# Patient Record
Sex: Female | Born: 1965
Health system: Southern US, Community
[De-identification: ages and names within clinical notes are randomized; demographics above are authoritative.]

## PROBLEM LIST (undated history)

## (undated) ENCOUNTER — Emergency Department (HOSPITAL_COMMUNITY): Admission: EM | Disposition: A | Discharge status: Home / Self Care | Payer: BC Managed Care – PPO

## (undated) DIAGNOSIS — J189 Pneumonia, unspecified organism: Secondary | ICD-10-CM

## (undated) DIAGNOSIS — D649 Anemia, unspecified: Secondary | ICD-10-CM

## (undated) DIAGNOSIS — E669 Obesity, unspecified: Secondary | ICD-10-CM

## (undated) DIAGNOSIS — I10 Essential (primary) hypertension: Secondary | ICD-10-CM

## (undated) DIAGNOSIS — H409 Unspecified glaucoma: Secondary | ICD-10-CM

## (undated) HISTORY — DX: Obesity, unspecified: E66.9

## (undated) HISTORY — PX: TUBAL LIGATION: SHX77

---

## 1999-02-27 ENCOUNTER — Emergency Department (HOSPITAL_COMMUNITY): Admission: EM | Admit: 1999-02-27 | Discharge: 1999-02-27 | Payer: Self-pay | Admitting: Emergency Medicine

## 2000-09-12 ENCOUNTER — Emergency Department (HOSPITAL_COMMUNITY): Admission: EM | Admit: 2000-09-12 | Discharge: 2000-09-12 | Payer: Self-pay | Admitting: Emergency Medicine

## 2000-09-12 ENCOUNTER — Encounter: Payer: Self-pay | Admitting: Emergency Medicine

## 2000-09-17 ENCOUNTER — Emergency Department (HOSPITAL_COMMUNITY): Admission: EM | Admit: 2000-09-17 | Discharge: 2000-09-17 | Payer: Self-pay | Admitting: Emergency Medicine

## 2000-11-21 ENCOUNTER — Ambulatory Visit (HOSPITAL_COMMUNITY): Admission: RE | Admit: 2000-11-21 | Discharge: 2000-11-21 | Payer: Self-pay | Admitting: Family Medicine

## 2000-11-21 ENCOUNTER — Encounter: Payer: Self-pay | Admitting: Family Medicine

## 2003-02-28 ENCOUNTER — Other Ambulatory Visit: Admission: RE | Admit: 2003-02-28 | Discharge: 2003-02-28 | Payer: Self-pay | Admitting: Family Medicine

## 2003-10-04 ENCOUNTER — Inpatient Hospital Stay (HOSPITAL_COMMUNITY): Admission: EM | Admit: 2003-10-04 | Discharge: 2003-10-09 | Payer: Self-pay | Admitting: Emergency Medicine

## 2003-10-04 DIAGNOSIS — J154 Pneumonia due to other streptococci: Secondary | ICD-10-CM | POA: Insufficient documentation

## 2003-10-30 ENCOUNTER — Ambulatory Visit (HOSPITAL_COMMUNITY): Admission: RE | Admit: 2003-10-30 | Discharge: 2003-10-30 | Payer: Self-pay | Admitting: Family Medicine

## 2003-12-10 ENCOUNTER — Ambulatory Visit (HOSPITAL_COMMUNITY): Admission: RE | Admit: 2003-12-10 | Discharge: 2003-12-10 | Payer: Self-pay | Admitting: Family Medicine

## 2004-03-06 ENCOUNTER — Ambulatory Visit: Payer: Self-pay | Admitting: *Deleted

## 2004-03-19 ENCOUNTER — Ambulatory Visit: Payer: Self-pay | Admitting: Family Medicine

## 2004-08-08 ENCOUNTER — Ambulatory Visit: Payer: Self-pay | Admitting: Internal Medicine

## 2004-12-29 ENCOUNTER — Ambulatory Visit: Payer: Self-pay | Admitting: Family Medicine

## 2004-12-31 ENCOUNTER — Ambulatory Visit: Payer: Self-pay | Admitting: Family Medicine

## 2005-03-11 ENCOUNTER — Ambulatory Visit: Payer: Self-pay | Admitting: Family Medicine

## 2005-03-23 ENCOUNTER — Ambulatory Visit: Payer: Self-pay | Admitting: Family Medicine

## 2005-08-24 ENCOUNTER — Ambulatory Visit: Payer: Self-pay | Admitting: Family Medicine

## 2005-09-25 ENCOUNTER — Ambulatory Visit: Payer: Self-pay | Admitting: Family Medicine

## 2005-10-02 ENCOUNTER — Ambulatory Visit: Payer: Self-pay | Admitting: Family Medicine

## 2005-12-03 ENCOUNTER — Ambulatory Visit: Payer: Self-pay | Admitting: Family Medicine

## 2005-12-07 ENCOUNTER — Ambulatory Visit: Payer: Self-pay | Admitting: Family Medicine

## 2005-12-09 ENCOUNTER — Ambulatory Visit: Payer: Self-pay | Admitting: Family Medicine

## 2005-12-14 ENCOUNTER — Ambulatory Visit: Payer: Self-pay | Admitting: Family Medicine

## 2006-02-10 ENCOUNTER — Ambulatory Visit: Payer: Self-pay | Admitting: Obstetrics & Gynecology

## 2006-02-10 ENCOUNTER — Encounter (INDEPENDENT_AMBULATORY_CARE_PROVIDER_SITE_OTHER): Payer: Self-pay | Admitting: Family Medicine

## 2006-02-12 ENCOUNTER — Ambulatory Visit (HOSPITAL_COMMUNITY): Admission: RE | Admit: 2006-02-12 | Discharge: 2006-02-12 | Payer: Self-pay | Admitting: Family Medicine

## 2006-02-17 ENCOUNTER — Ambulatory Visit: Payer: Self-pay | Admitting: Obstetrics and Gynecology

## 2006-03-24 ENCOUNTER — Ambulatory Visit: Payer: Self-pay | Admitting: Obstetrics & Gynecology

## 2006-03-24 ENCOUNTER — Encounter (INDEPENDENT_AMBULATORY_CARE_PROVIDER_SITE_OTHER): Payer: Self-pay | Admitting: *Deleted

## 2006-03-24 ENCOUNTER — Ambulatory Visit (HOSPITAL_COMMUNITY): Admission: RE | Admit: 2006-03-24 | Discharge: 2006-03-24 | Payer: Self-pay | Admitting: Obstetrics & Gynecology

## 2006-04-07 ENCOUNTER — Ambulatory Visit: Payer: Self-pay | Admitting: Obstetrics & Gynecology

## 2006-04-21 ENCOUNTER — Ambulatory Visit: Payer: Self-pay | Admitting: Internal Medicine

## 2006-06-15 ENCOUNTER — Ambulatory Visit: Payer: Self-pay | Admitting: Family Medicine

## 2006-12-13 ENCOUNTER — Ambulatory Visit: Payer: Self-pay | Admitting: Family Medicine

## 2006-12-17 ENCOUNTER — Ambulatory Visit: Payer: Self-pay | Admitting: Family Medicine

## 2006-12-20 ENCOUNTER — Ambulatory Visit: Payer: Self-pay | Admitting: Family Medicine

## 2006-12-31 ENCOUNTER — Encounter (INDEPENDENT_AMBULATORY_CARE_PROVIDER_SITE_OTHER): Payer: Self-pay | Admitting: Family Medicine

## 2006-12-31 DIAGNOSIS — D649 Anemia, unspecified: Secondary | ICD-10-CM | POA: Insufficient documentation

## 2006-12-31 DIAGNOSIS — I10 Essential (primary) hypertension: Secondary | ICD-10-CM | POA: Insufficient documentation

## 2006-12-31 DIAGNOSIS — H409 Unspecified glaucoma: Secondary | ICD-10-CM | POA: Insufficient documentation

## 2006-12-31 DIAGNOSIS — J309 Allergic rhinitis, unspecified: Secondary | ICD-10-CM | POA: Insufficient documentation

## 2006-12-31 DIAGNOSIS — H269 Unspecified cataract: Secondary | ICD-10-CM | POA: Insufficient documentation

## 2006-12-31 DIAGNOSIS — K219 Gastro-esophageal reflux disease without esophagitis: Secondary | ICD-10-CM | POA: Insufficient documentation

## 2007-02-02 ENCOUNTER — Encounter (INDEPENDENT_AMBULATORY_CARE_PROVIDER_SITE_OTHER): Payer: Self-pay | Admitting: *Deleted

## 2007-06-16 ENCOUNTER — Telehealth (INDEPENDENT_AMBULATORY_CARE_PROVIDER_SITE_OTHER): Payer: Self-pay | Admitting: Nurse Practitioner

## 2007-06-16 ENCOUNTER — Ambulatory Visit: Payer: Self-pay | Admitting: Internal Medicine

## 2007-06-17 ENCOUNTER — Telehealth (INDEPENDENT_AMBULATORY_CARE_PROVIDER_SITE_OTHER): Payer: Self-pay | Admitting: Internal Medicine

## 2007-07-22 ENCOUNTER — Telehealth (INDEPENDENT_AMBULATORY_CARE_PROVIDER_SITE_OTHER): Payer: Self-pay | Admitting: Nurse Practitioner

## 2007-08-22 ENCOUNTER — Ambulatory Visit: Payer: Self-pay | Admitting: Family Medicine

## 2007-09-09 ENCOUNTER — Ambulatory Visit: Payer: Self-pay | Admitting: Nurse Practitioner

## 2007-09-28 ENCOUNTER — Telehealth (INDEPENDENT_AMBULATORY_CARE_PROVIDER_SITE_OTHER): Payer: Self-pay | Admitting: Internal Medicine

## 2007-10-22 ENCOUNTER — Encounter (INDEPENDENT_AMBULATORY_CARE_PROVIDER_SITE_OTHER): Payer: Self-pay | Admitting: Nurse Practitioner

## 2007-12-02 ENCOUNTER — Ambulatory Visit: Payer: Self-pay | Admitting: Family Medicine

## 2007-12-05 ENCOUNTER — Ambulatory Visit: Payer: Self-pay | Admitting: Family Medicine

## 2007-12-07 ENCOUNTER — Telehealth (INDEPENDENT_AMBULATORY_CARE_PROVIDER_SITE_OTHER): Payer: Self-pay | Admitting: *Deleted

## 2008-01-24 ENCOUNTER — Encounter (INDEPENDENT_AMBULATORY_CARE_PROVIDER_SITE_OTHER): Payer: Self-pay | Admitting: Family Medicine

## 2008-01-24 ENCOUNTER — Ambulatory Visit: Payer: Self-pay | Admitting: Family Medicine

## 2008-01-24 DIAGNOSIS — R809 Proteinuria, unspecified: Secondary | ICD-10-CM | POA: Insufficient documentation

## 2008-01-24 LAB — CONVERTED CEMR LAB
Bilirubin Urine: NEGATIVE
Chlamydia, DNA Probe: NEGATIVE
GC Probe Amp, Genital: NEGATIVE
Ketones, urine, test strip: NEGATIVE
Protein, U semiquant: 100
Specific Gravity, Urine: 1.02

## 2008-01-31 ENCOUNTER — Ambulatory Visit (HOSPITAL_COMMUNITY): Admission: RE | Admit: 2008-01-31 | Discharge: 2008-01-31 | Payer: Self-pay | Admitting: Family Medicine

## 2008-02-05 ENCOUNTER — Encounter (INDEPENDENT_AMBULATORY_CARE_PROVIDER_SITE_OTHER): Payer: Self-pay | Admitting: Family Medicine

## 2008-10-03 ENCOUNTER — Telehealth (INDEPENDENT_AMBULATORY_CARE_PROVIDER_SITE_OTHER): Payer: Self-pay | Admitting: Family Medicine

## 2008-11-12 ENCOUNTER — Ambulatory Visit: Payer: Self-pay | Admitting: Nurse Practitioner

## 2008-11-12 DIAGNOSIS — F17200 Nicotine dependence, unspecified, uncomplicated: Secondary | ICD-10-CM | POA: Insufficient documentation

## 2008-11-13 ENCOUNTER — Telehealth (INDEPENDENT_AMBULATORY_CARE_PROVIDER_SITE_OTHER): Payer: Self-pay | Admitting: Nurse Practitioner

## 2008-11-14 ENCOUNTER — Encounter (INDEPENDENT_AMBULATORY_CARE_PROVIDER_SITE_OTHER): Payer: Self-pay | Admitting: Nurse Practitioner

## 2008-11-15 ENCOUNTER — Ambulatory Visit: Payer: Self-pay | Admitting: Physician Assistant

## 2008-11-26 ENCOUNTER — Ambulatory Visit: Payer: Self-pay | Admitting: Nurse Practitioner

## 2008-11-28 ENCOUNTER — Ambulatory Visit: Payer: Self-pay | Admitting: Nurse Practitioner

## 2008-11-28 ENCOUNTER — Telehealth (INDEPENDENT_AMBULATORY_CARE_PROVIDER_SITE_OTHER): Payer: Self-pay | Admitting: Nurse Practitioner

## 2008-12-13 ENCOUNTER — Ambulatory Visit: Payer: Self-pay | Admitting: Physician Assistant

## 2009-03-15 ENCOUNTER — Ambulatory Visit: Payer: Self-pay | Admitting: Nurse Practitioner

## 2009-03-15 ENCOUNTER — Telehealth (INDEPENDENT_AMBULATORY_CARE_PROVIDER_SITE_OTHER): Payer: Self-pay | Admitting: Nurse Practitioner

## 2009-03-15 DIAGNOSIS — G44209 Tension-type headache, unspecified, not intractable: Secondary | ICD-10-CM | POA: Insufficient documentation

## 2009-03-27 ENCOUNTER — Encounter (INDEPENDENT_AMBULATORY_CARE_PROVIDER_SITE_OTHER): Payer: Self-pay | Admitting: Nurse Practitioner

## 2009-03-27 ENCOUNTER — Telehealth (INDEPENDENT_AMBULATORY_CARE_PROVIDER_SITE_OTHER): Payer: Self-pay | Admitting: Nurse Practitioner

## 2009-04-04 ENCOUNTER — Ambulatory Visit: Payer: Self-pay | Admitting: Physician Assistant

## 2009-04-24 ENCOUNTER — Telehealth (INDEPENDENT_AMBULATORY_CARE_PROVIDER_SITE_OTHER): Payer: Self-pay | Admitting: Nurse Practitioner

## 2009-05-13 ENCOUNTER — Telehealth (INDEPENDENT_AMBULATORY_CARE_PROVIDER_SITE_OTHER): Payer: Self-pay | Admitting: Nurse Practitioner

## 2009-11-04 ENCOUNTER — Ambulatory Visit: Payer: Self-pay | Admitting: Nurse Practitioner

## 2009-11-04 DIAGNOSIS — E6609 Other obesity due to excess calories: Secondary | ICD-10-CM | POA: Insufficient documentation

## 2009-11-04 LAB — CONVERTED CEMR LAB
Blood in Urine, dipstick: NEGATIVE
Nitrite: NEGATIVE
Urobilinogen, UA: 0.2

## 2009-11-05 ENCOUNTER — Encounter (INDEPENDENT_AMBULATORY_CARE_PROVIDER_SITE_OTHER): Payer: Self-pay | Admitting: Nurse Practitioner

## 2009-11-05 LAB — CONVERTED CEMR LAB
AST: 11 units/L (ref 0–37)
BUN: 13 mg/dL (ref 6–23)
Basophils Relative: 1 % (ref 0–1)
Calcium: 8.9 mg/dL (ref 8.4–10.5)
Chlamydia, DNA Probe: NEGATIVE
Chloride: 107 meq/L (ref 96–112)
Cholesterol: 158 mg/dL (ref 0–200)
Creatinine, Ser: 0.82 mg/dL (ref 0.40–1.20)
Eosinophils Relative: 2 % (ref 0–5)
GC Probe Amp, Genital: NEGATIVE
Glucose, Bld: 83 mg/dL (ref 70–99)
Hemoglobin: 9.9 g/dL — ABNORMAL LOW (ref 12.0–15.0)
Lymphocytes Relative: 20 % (ref 12–46)
MCHC: 30.2 g/dL (ref 30.0–36.0)
Monocytes Relative: 17 % — ABNORMAL HIGH (ref 3–12)
Neutro Abs: 2.5 10*3/uL (ref 1.7–7.7)
Total Protein: 8.1 g/dL (ref 6.0–8.3)
VLDL: 19 mg/dL (ref 0–40)
WBC: 4.2 10*3/uL (ref 4.0–10.5)

## 2009-11-07 ENCOUNTER — Ambulatory Visit: Payer: Self-pay | Admitting: Nurse Practitioner

## 2009-11-07 ENCOUNTER — Telehealth (INDEPENDENT_AMBULATORY_CARE_PROVIDER_SITE_OTHER): Payer: Self-pay | Admitting: Nurse Practitioner

## 2009-11-08 ENCOUNTER — Ambulatory Visit (HOSPITAL_COMMUNITY): Admission: RE | Admit: 2009-11-08 | Discharge: 2009-11-08 | Payer: Self-pay | Admitting: Internal Medicine

## 2009-11-11 ENCOUNTER — Telehealth (INDEPENDENT_AMBULATORY_CARE_PROVIDER_SITE_OTHER): Payer: Self-pay | Admitting: Nurse Practitioner

## 2009-11-13 ENCOUNTER — Encounter (INDEPENDENT_AMBULATORY_CARE_PROVIDER_SITE_OTHER): Payer: Self-pay | Admitting: *Deleted

## 2009-11-21 ENCOUNTER — Encounter (INDEPENDENT_AMBULATORY_CARE_PROVIDER_SITE_OTHER): Payer: Self-pay | Admitting: Nurse Practitioner

## 2010-01-29 ENCOUNTER — Telehealth (INDEPENDENT_AMBULATORY_CARE_PROVIDER_SITE_OTHER): Payer: Self-pay | Admitting: Nurse Practitioner

## 2010-05-01 ENCOUNTER — Telehealth (INDEPENDENT_AMBULATORY_CARE_PROVIDER_SITE_OTHER): Payer: Self-pay | Admitting: Nurse Practitioner

## 2010-06-17 NOTE — Assessment & Plan Note (Signed)
Summary: TB TEST CK  / NS   Nurse Visit   Allergies: 1)  ! Tylox  PPD Results    Date of reading: 11/07/2009    Results: < 5mm    Interpretation: negative  Orders Added: 1)  No Charge Patient Arrived (NCPA0) [NCPA0]

## 2010-06-17 NOTE — Letter (Signed)
Summary: Generic Letter  [facility]  [address] [place], [place] [address]   Phone: [phone]  Fax: [phone]    11/13/2009  [facility] [address] [place], [place]  [address]  Dear Ms. Mayford Knife,  We have been unable to contact you by telephone. Please call our office, at your earliest convenience, so that we may speak with you.   Sincerely,   Dutch Quint RN

## 2010-06-17 NOTE — Progress Notes (Signed)
Summary: [facility]   Phone Note From Other Clinic   Summary of Call: Tonya from [facility] called not enough blood to add b12 and ferritin. Initial call taken by: Vesta Mixer CMA,  November 07, 2009 12:18 PM  Follow-up for Phone Call        noted. will have pt start iron and recheck in anemia panel in 6 weeks Follow-up by: Lehman Prom FNP,  November 07, 2009 12:53 PM

## 2010-06-17 NOTE — Assessment & Plan Note (Signed)
Summary: Complete Physical Exam   Vital Signs:  Patient profile:   45 year old female Menstrual status:  regular LMP:     10/15/2009 Height:      66.5 inches Weight:      233.5 pounds BMI:     37.26 BSA:     2.17 Temp:     97.8 degrees F oral Pulse rate:   84 / minute Pulse rhythm:   regular Resp:     20 per minute BP sitting:   160 / 88  (left arm) Cuff size:   regular  Vitals Entered By: Gaylyn Cheers RN (November 04, 2009 9:03 AM)  Nutrition Counseling: Patient's BMI is greater than 25 and therefore counseled on weight management options. CC: CPP, TB test, Hypertension Management, Headache Is Patient Diabetic? No Pain Assessment Patient in pain? no       Does patient need assistance? Functional Status Self care Ambulation Normal Comments out of Atenolol and HCTZ for LMP (date): 10/15/2009 LMP - Character: normal     Menstrual Status regular Enter LMP: 10/15/2009 Last PAP Result NEGATIVE FOR INTRAEPITHELIAL LESIONS OR MALIGNANCY.   Primary Care Provider:  Lehman Prom FNP  CC:  CPP, TB test, Hypertension Management, and Headache.  History of Present Illness:  Pt into the office for a complete physical exam  PAP - Last done in 2009. All previous PAP Smears have been normal menses monthtly s/p tubal ligation x 14 years ago 2 children ages 80 and 82  Mammogram - last done in 2009 no family history of breast cancer no self breast checks at home  Dental - Pt has not been to the dentist in several years. She does have a tooth that needs to be removed but is prepared to find her own dentist and incur the cost  Optho - She had eye exams in Februay 2011, then May 5th, 2011 and her next appt is in August. pt has a history of glaucoma.  Obesity - no meaningful activity due to stretched work schedule Pt eats only once per day  Headache HPI:      Additional history: still takes naprosyn as needed for headache.    Hypertension History:      She complains of  headache, but denies chest pain and palpitations.  She notes no problems with any antihypertensive medication side effects.  No BP meds x 1 week - needs refill.  Further comments include: last blood pressure was fine when pt was taking medication.        Positive major cardiovascular risk factors include hypertension and current tobacco user.  Negative major cardiovascular risk factors include female age less than 72 years old.       Habits & Providers  Alcohol-Tobacco-Diet     Alcohol drinks/day: <1     Alcohol type: beer     Tobacco Status: current     Tobacco Counseling: to quit use of tobacco products     Cigarette Packs/Day: <0.25     Year Started: 2003  Exercise-Depression-Behavior     Does Patient Exercise: no     Have you felt down or hopeless? no     Have you felt little pleasure in things? no     Depression Counseling: not indicated; screening negative for depression     STD Risk: never     Drug Use: never     Seat Belt Use: always     Sun Exposure: infrequent  Comments: "I don't have time for exercise" PHQ-9  score = 3  Allergies (verified): 1)  ! Tylox  Social History: Packs/Day:  <0.25 Does Patient Exercise:  no STD Risk:  never Drug Use:  never Seat Belt Use:  always Sun Exposure-Excessive:  infrequent  Review of Systems General:  Denies fever. Eyes:  Denies blurring; wears glasses - last test done in 06/2009 . ENT:  Denies earache. CV:  Denies chest pain or discomfort. Resp:  Denies cough. GI:  Denies abdominal pain and constipation. GU:  Denies dysuria. MS:  Denies joint pain, muscle, and cramps. Derm:  Denies rash. Neuro:  Denies headaches. Psych:  Denies anxiety and depression.  Physical Exam  General:  alert.   Head:  normocephalic.   Eyes:  glasses Ears:  bil TM with bony landmarks present Nose:  no nasal discharge.   Mouth:  pharynx pink and moist and poor dentition.   Neck:  supple.   Chest Wall:  no mass.   Breasts:  no masses and  no abnormal thickening.   Lungs:  normal breath sounds.   Heart:  normal rate and regular rhythm.   Abdomen:  soft, non-tender, and normal bowel sounds.   Rectal:  no external abnormalities.   Msk:  up to the exam table Pulses:  R radial normal and L radial normal.   Extremities:  no edema Neurologic:  alert & oriented X3.   Skin:  color normal.   Psych:  Oriented X3.    Pelvic Exam  Vulva:      normal appearance.   Urethra and Bladder:      Urethra--no discharge.   Vagina:      copious discharge.   Cervix:      midposition.   Adnexa:      nontender bilaterally.   Rectum:      normal, heme negative stool.      Impression & Recommendations:  Problem # 1:  ROUTINE GYNECOLOGICAL EXAMINATION (ICD-V72.31) PAP done today labs done  EKG done continue optho f/u as scheduled rec dental exam PHQ-9 score = 3 tdap up to date PPD placed today for work Orders: Hemoccult Guaiac-1 spec.(in office) (82270) KOH/ WET Mount [id number]) Pap Smear, Thin Prep ( Collection of) (J8119) UA Dipstick w/o Micro (manual) (14782) T-Comprehensive Metabolic Panel [phone]) T-Lipid Profile (78469-62952) T-CBC w/Diff (84132-44010) T- GC Chlamydia (27253) T-TSH (66440-34742) Rapid HIV  (59563)  Problem # 2:  UNSPECIFIED BREAST SCREENING (ICD-V76.10) self breast exam placcard given mammogram ordered Orders: Mammogram (Screening) (Mammo)  Problem # 3:  TOBACCO ABUSE (ICD-305.1) advised smoking cessation  Problem # 4:  HYPERTENSION (ICD-401.9) BP elevated today but likely due to not taking meds for the past month. will give 90 day supply of meds handout given on low sodium diet EKG done today Her updated medication list for this problem includes:    Atenolol 100 Mg Tabs (Atenolol) ..... One tablet by mouth nightly for blood pressure    Hydrochlorothiazide 25 Mg Tabs (Hydrochlorothiazide) .Marland Kitchen... Take one (1) by mouth daily  Orders: EKG w/ Interpretation (93000) T-Urine Microalbumin  w/creat. ratio [phone])  Problem # 5:  OBESITY (ICD-278.00) advised pt to try to find time in her schedule for activity also need to eat several small meals per day  Complete Medication List: 1)  Atenolol 100 Mg Tabs (Atenolol) .... One tablet by mouth nightly for blood pressure 2)  Allegra 180 Mg Tabs (Fexofenadine hcl) .... By mouth once daily as needed for allergies 3)  Famotidine 20 Mg Tabs (Famotidine) .... One by mouth two  times a day 4)  Ferrous Sulfate 325 (65 Fe) Mg Tabs (Ferrous sulfate) .... One by mouth three times a day 5)  Naprosyn 500 Mg Tabs (Naproxen) .... One tablet by mouth two times a day as needed for pain 6)  Nasacort Aq 55 Mcg/act Aers (Triamcinolone acetonide(nasal)) .Marland Kitchen.. 1 spray in each nostril two times a day 7)  Flovent Hfa 44 Mcg/act Aero (Fluticasone propionate  hfa) .Marland Kitchen.. 1 spray two times a day 8)  Hydrochlorothiazide 25 Mg Tabs (Hydrochlorothiazide) .... Take one (1) by mouth daily 9)  Cyclobenzaprine Hcl 10 Mg Tabs (Cyclobenzaprine hcl) .... One tablet by mouth nightly as needed for muscles  Other Orders: TB Skin Test [phone]) Admin 1st Vaccine (21308) Admin 1st Vaccine [facility]) [phone])  Hypertension Assessment/Plan:      The patient's hypertensive risk group is category B: At least one risk factor (excluding diabetes) with no target organ damage.  Today's blood pressure is 160/88.  Her blood pressure goal is < 140/90.  Patient Instructions: 1)  Follow up on Thursday for PPD reading. 2)  Your blood pressure is high today but that is likely because you have not taken your medications.  Get your medications refilled and take it daily.  You should get a 90 day supply 3)  You will be notified of any abnormal lab results  4)  Follow up in 6 months for high blood pressure or sooner if necessary. Prescriptions: ATENOLOL 100 MG TABS (ATENOLOL) One tablet by mouth nightly for blood pressure  #90 x 1   Entered and Authorized by:   Lehman Prom FNP    Signed by:   Lehman Prom FNP on 11/04/2009   Method used:   Print then Give to Patient   RxID:   [id number] HYDROCHLOROTHIAZIDE 25 MG TABS (HYDROCHLOROTHIAZIDE) Take one (1) by mouth daily  #90 x 1   Entered and Authorized by:   Lehman Prom FNP   Signed by:   Lehman Prom FNP on 11/04/2009   Method used:   Print then Give to Patient   RxID:   [id number]   Prevention & Chronic Care Immunizations   Influenza vaccine: Not documented    Tetanus booster: 12/03/2005: Historical    Pneumococcal vaccine: Historical  (12/19/2005)  Other Screening   Pap smear: NEGATIVE FOR INTRAEPITHELIAL LESIONS OR MALIGNANCY.  (01/24/2008)   Pap smear action/deferral: Ordered  (11/04/2009)   Pap smear due: 11/05/2010    Mammogram: normal  (01/31/2008)   Mammogram action/deferral: Ordered  (11/04/2009)   Smoking status: current  (11/04/2009)   Smoking cessation counseling: yes  (11/04/2009)  Lipids   Total Cholesterol: Not documented   LDL: Not documented   LDL Direct: Not documented   HDL: Not documented   Triglycerides: Not documented  Hypertension   Last Blood Pressure: 160 / 88  (11/04/2009)   Serum creatinine: Not documented   BMP action: Ordered   Serum potassium Not documented CMP ordered   Self-Management Support :    Hypertension self-management support: Not documented  Laboratory Results   Urine Tests  Date/Time Received: November 04, 2009 10:05 AM   Routine Urinalysis   Color: lt. yellow Glucose: negative   (Normal Range: Negative) Bilirubin: negative   (Normal Range: Negative) Ketone: negative   (Normal Range: Negative) Spec. Gravity: >=1.030   (Normal Range: 1.003-1.035) Blood: negative   (Normal Range: Negative) pH: 6.0   (Normal Range: 5.0-8.0) Protein: negative   (Normal Range: Negative) Urobilinogen: 0.2   (Normal Range: 0-1) Nitrite: negative   (  Normal Range: Negative) Leukocyte Esterace: negative   (Normal Range: Negative)    Date/Time  Received: November 04, 2009 10:07 AM   Wet Mount Source: vaginal WBC/hpf: 1-5 Bacteria/hpf: rare Clue cells/hpf: none Yeast/hpf: none Wet Mount KOH: Negative Trichomonas/hpf: none  Other Tests  Rapid HIV: negative  Stool - Occult Blood Hemmoccult #1: negative Date: 10/28/2009       Laboratory Results   Urine Tests    Routine Urinalysis   Color: lt. yellow Glucose: negative   (Normal Range: Negative) Bilirubin: negative   (Normal Range: Negative) Ketone: negative   (Normal Range: Negative) Spec. Gravity: >=1.030   (Normal Range: 1.003-1.035) Blood: negative   (Normal Range: Negative) pH: 6.0   (Normal Range: 5.0-8.0) Protein: negative   (Normal Range: Negative) Urobilinogen: 0.2   (Normal Range: 0-1) Nitrite: negative   (Normal Range: Negative) Leukocyte Esterace: negative   (Normal Range: Negative)      Wet Mount/KOH  Other Tests  Rapid HIV: negative     PPD Application    Vaccine Type: PPD    Site: left forearm    Mfr: Sanofi Pasteur    Dose: 0.1 ml    Route: ID    Given by: Gaylyn Cheers RN    Exp. Date: 06/15/2011    Lot #: [id number]

## 2010-06-17 NOTE — Progress Notes (Signed)
Summary: Refill Naprosyn and Famotidine  Phone Note Refill Request Call back at [phone] Message from:  Connie Bryant  Refills Requested: Medication #1:  FAMOTIDINE 20 MG  TABS One by mouth two times a day  Medication #2:  NAPROSYN 500 MG TABS One tablet by mouth two times a day as needed for pain [facility] [address]  Initial call taken by: Oscar La,  January 29, 2010 2:47 PM  Follow-up for Phone Call        Refills completed and pt. notified.  Dutch Quint RN  January 29, 2010 2:52 PM     Prescriptions: NAPROSYN 500 MG TABS (NAPROXEN) One tablet by mouth two times a day as needed for pain  #50 x 0   Entered by:   Dutch Quint RN   Authorized by:   Lehman Prom FNP   Signed by:   Dutch Quint RN on 01/29/2010   Method used:   Electronically to        [facility] [phone]* (retail)       [address]       [place], [place]  [address]       Ph: [phone]       Fax: [phone]   RxID:   [id number] FAMOTIDINE 20 MG  TABS (FAMOTIDINE) One by mouth two times a day  #60 Each x 4   Entered by:   Dutch Quint RN   Authorized by:   Lehman Prom FNP   Signed by:   Dutch Quint RN on 01/29/2010   Method used:   Electronically to        [facility] [phone]* (retail)       [address]       [place], [place]  [address]       Ph: [phone]       Fax: [phone]   RxID:   [id number]

## 2010-06-17 NOTE — Letter (Signed)
Summary: Handout Printed  Printed Handout:  - Diet - Sodium-Controlled 

## 2010-06-17 NOTE — Progress Notes (Signed)
Summary: [name]   Phone Note Call from Patient Call back at [phone]   Summary of Call: [name] Connie Bryant THAT DR Barbaraann Barthel PRESCRIBED, BUT SHE STOPPED IT BECAUSE IT MADE HER NASUATED. SO IF YOU CALL SOMETHING IN, IT MAY NEED TO BE DIFFERENT KIND.  SHE USES [facility], BUT SHE SAYS THAT IF ITS SOMETHING AT [facility] [address] FOR $4, THEN SHE'LL GET IT THERE, IF NOT THEN [facility]. Initial call taken by: Leodis Rains,  November 11, 2009 2:47 PM  Follow-up for Phone Call        she can take over the counter iron - ferrous sulfate 325mg , one tablet by mouth three times a day  no prescription necessary perhaps she should try eating BEFORE she takes the iron tablet Follow-up by: Lehman Prom FNP,  November 13, 2009 7:49 AM  Additional Follow-up for Phone Call Additional follow up Details #1::        SPOKE WITH [name] AND SHE IS AWARE OF THE ABOVE MESSSAGE, AND SHE WILL PURCHASE IRON MEDS. FORM [facility]. SHE SAYS THAT SHE DOES ET BEFORE TAKING THE Bryant, BUT SHE STILL FEELS THE SAME. Additional Follow-up by: Leodis Rains,  November 13, 2009 12:37 PM

## 2010-06-19 NOTE — Progress Notes (Signed)
Summary: Wants to take vitamin  Phone Note Call from Patient   Summary of Call: PT CALLED SAID SHE CALLED 2 WEEKS AGO AND ASK IF IT WAS O.K FOR HER TO TAKE THE VITIMIN "ALIVE".NOBODY TOOK THE MESSAGE FOR HER SO SHE HAS WAITED 2 WKS FOR A CAll //PLEASE CALL HER BACK [phone] Initial call taken by: Arta Bruce,  May 01, 2010 12:28 PM  Follow-up for Phone Call        I am unfamiliar with this product -- your recommendation?  Dutch Quint RN  May 01, 2010 1:07 PM   Additional Follow-up for Phone Call Additional follow up Details #1::        i would need to know the ingredients in the vitamin.  i am unfamiliar with it as well. Can she bring a label or write down the contents and bring it here for me to review. Vitamins have MANY different names mostly due to a small change in ingredients so sorry i am not familiar with that particular vitamin Additional Follow-up by: Lehman Prom FNP,  May 01, 2010 1:18 PM    Additional Follow-up for Phone Call Additional follow up Details #2::    Pt. advised of provider's response -- verbalized understanding and agreement.  Dutch Quint RN  May 01, 2010 2:18 PM

## 2010-06-20 NOTE — Progress Notes (Signed)
Summary: Office Visit//DEPRESSION SCREENING  Office Visit//DEPRESSION SCREENING   Imported By: Arta Bruce 11/27/2009 15:23:12  _____________________________________________________________________  External Attachment:    Type:   Image     Comment:   External Document

## 2010-07-24 ENCOUNTER — Telehealth (INDEPENDENT_AMBULATORY_CARE_PROVIDER_SITE_OTHER): Payer: Self-pay | Admitting: Nurse Practitioner

## 2010-07-29 NOTE — Progress Notes (Signed)
Summary: Query:  Refill atenolol, HCTZ, famotidine & naprosyn?  Phone Note Call from Patient Call back at Home Phone [phone]   Reason for Call: Refill Medication Summary of Call: Connie PT. [name] CALLED AND SAYS THAT SHE NEEDS A REFILL ON HER ATENOLOL, HCTZ, FAMOTIDINE AND NAPROSYN. SHE USES [facility] [address].  PATIENT RECENTLY GOT MARRIED AND HER LAST NAME IS  Bumpas. Initial call taken by: Leodis Rains,  July 24, 2010 2:51 PM  Follow-up for Phone Call        Last seen 10/2009 for CPE.  Refill meds per protocol? Follow-up by: Dutch Quint RN,  July 25, 2010 12:14 PM  Additional Follow-up for Phone Call Additional follow up Details #1::        ok to refill per protocol Additional Follow-up by: Lehman Prom FNP,  July 25, 2010 12:39 PM    Additional Follow-up for Phone Call Additional follow up Details #2::    Noted.  Refills completed. Pt. notified.  Dutch Quint RN  July 25, 2010 12:50 PM   Prescriptions: HYDROCHLOROTHIAZIDE 25 MG TABS (HYDROCHLOROTHIAZIDE) Take one (1) by mouth daily  #90 x 0   Entered by:   Dutch Quint RN   Authorized by:   Lehman Prom FNP   Signed by:   Dutch Quint RN on 07/25/2010   Method used:   Electronically to        [facility] [phone]* (retail)       [address]       [place], [place]  [address]       Ph: [phone]       Fax: [phone]   RxID:   [id number] NAPROSYN 500 MG TABS (NAPROXEN) One tablet by mouth two times a day as needed for pain  #50 x 0   Entered by:   Dutch Quint RN   Authorized by:   Lehman Prom FNP   Signed by:   Dutch Quint RN on 07/25/2010   Method used:   Electronically to        [facility] [phone]* (retail)       [address] [place] [address]       [place], [place]  [address]       Ph: [phone]       Fax: [phone]   RxID:   [id number] FAMOTIDINE 20 MG  TABS (FAMOTIDINE) One by mouth two times a day  #60 Each x 3   Entered by:   Dutch Quint RN   Authorized by:    Lehman Prom FNP   Signed by:   Dutch Quint RN on 07/25/2010   Method used:   Electronically to        [facility] [phone]* (retail)       [address]       [place], [place]  [address]       Ph: [phone]       Fax: [phone]   RxID:   [id number] ATENOLOL 100 MG TABS (ATENOLOL) One tablet by mouth nightly for blood pressure  #90 x 0   Entered by:   Dutch Quint RN   Authorized by:   Lehman Prom FNP   Signed by:   Dutch Quint RN on 07/25/2010   Method used:   Electronically to        [facility] [phone]* (retail)       [address]       [place], [place]  [address]       Ph: [phone]       Fax: [phone]   RxID:   [id number]

## 2010-10-03 NOTE — Group Therapy Note (Signed)
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 Penne Lash who has seen her previously for a vaginal  myomectomy.           ______________________________  Argentina Donovan, MD     PR/MEDQ  D:  02/17/2006  T:  02/19/2006  Job:  [id number]

## 2010-10-03 NOTE — Op Note (Signed)
NAMELORIENE, TAUNTON NO.:  [id number]   MEDICAL RECORD NO.:  [id number]          PATIENT TYPE:  AMB   LOCATION:  [facility]                           FACILITY:  [facility]   PHYSICIAN:  Lesly Dukes, M.D. 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   PROCEDURE:  Examination under anesthesia, removal of cervical mass.   SURGEON:  Lesly Dukes, M.D.   ASSISTANT:  Ginger Carne, MD   ANESTHESIA:  General.   SPECIMENS:  Cervical mass.   ESTIMATED BLOOD LOSS:  50 cc.   COMPLICATIONS:  None.   PROCEDURE:  After informed consent was obtained, the patient was taken to  the operating room where general anesthesia was found to be adequate.  The  patient was placed in the dorsal lithotomy position and prepped and draped  in the normal sterile fashion.  The bladder was emptied.  Exam under  anesthesia performed . Retractors were placed into the vagina, and the mass  was grasped with the tenaculum.  It was found to be attached to the cervix  anteriorly.  This was removed with cautery.  There was good hemostasis.  The  uterus was then sounded to about 9.5 cm.  A couple of sutures of Vicryl were  used to aid in hemostasis in some areas that were slightly deeper to the  anterior lip.  A little bit of Monsel's was also applied.  There were no  other fibroids or masses felt on bimanual exam.  The patient tolerated the  procedure well.  The sponge, lap, instrument, and needle count were correct  x2.  Patient went to the recovery room in stable condition.           ______________________________  Lesly Dukes, M.D.     KHL/MEDQ  D:  03/24/2006  T:  03/24/2006  Job:  [id number]

## 2010-10-03 NOTE — Discharge Summary (Signed)
Connie Bryant, Connie Bryant NO.:  [id number]   MEDICAL RECORD NO.:  [id number]                   PATIENT TYPE:  INP   LOCATION:  5733                                 FACILITY:  [facility]   PHYSICIAN:  Hollice Espy, M.D.            DATE OF BIRTH:  1965-11-30   DATE OF ADMISSION:  10/04/2003  DATE OF DISCHARGE:  10/09/2003                                 DISCHARGE SUMMARY   DISCHARGE DIAGNOSES:  1. Streptococcus pneumococcus bacteremia secondary to #2.  2. Streptococcus pneumonia, right lower lobe.  3. Hypertension.  4. Iron-deficiency anemia.  5. Tobacco abuse.  6. History of glaucoma.  7. Hypokalemia.   DISCHARGE MEDICATIONS:  1. Ampicillin 500 mg p.o. every 6 hours for 8 days.  2. Atenolol 50 mg daily.  3. Travatan 1 drop each eye once daily.  4. Cosopt 1 drop each eye b.i.d.  5. Albuterol 2 puffs 4 times daily p.r.n.   HISTORY OF PRESENT ILLNESS:  This is a 45 year old African American female  with past medical history of tobacco abuse and hypertension and glaucoma,  who has been complaining of productive cough and shortness of breath for 10  days.  She lives at home with her two children who apparently had recently  receiving treatments for bronchitis.  She started having some persistent  nausea and vomiting and was unable to take much p.o.  She presented to the  emergency room on Oct 04, 2003.  At that time, she was noted to have a  potassium of 2.6 and a chest x-ray showed evidence of a right lower lobe  pneumonia.  The patient was started on IV fluids, had blood cultures drawn,  and was started on antibiotics.  By the second day, her blood culture was  noted to be positive for gram-positive cocci in pairs and chains.  The  cultures grew out Streptococcus pneumoniae which ended up being pan  sensitive including penicillin and ceftriaxone.  The patient was continued  on IV antibiotics which she tolerated well.   HOSPITAL COURSE:  1. In  regard to the patient's positive blood cultures and her pneumonia, she     was continued initially on Rocephin and Zithromax until her sensitivities     came back on the 23rd.  At this point, her white count had decreased     initially from 19.4 coming down from a near-normal range at 11.4 on May     22.  At this time, the patient was breathing much more comfortably and     was able to be weaned off of oxygen on May 23.  She was continued on IV     antibiotics until the May 23, when she was changed over to p.o.     antibiotics.  She tolerated this well and had no further problems.     Currently, she is breathing comfortably.  I have advised her to quit  smoking.  She will be discharged on an albuterol inhaler to take as     needed.  In addition, she will take 8 more days of ampicillin 500 mg     every 6 hours for a sum total of 14 days.  She had a followup chest x-ray     done on May 22, which showed evidence of persistent pneumonia, but with     some resolution.  The patient will follow up with her PCP in 1-2 weeks at     [facility] and get a repeat chest x-ray then.  2. In regard to the patient's history of glaucoma, she was continued on her     Cosopt and Travatan eye drops.  3. In regard to the patient's hypertension, she was continued on her     atenolol.  Her blood pressure was initially slightly elevated on     admission, but this improved.  4. In regard to the patient hypokalemia, she received potassium supplements.     It was felt her low potassium was subsequently secondary to her nausea     and vomiting.  Once she started receiving antibiotics and IV fluids, her     disposition improved and she was able to tolerate p.o. and her potassium     was within the normal range.  A potassium was checked on 5/21 and found     to be at 4.2.  5. In regard to the patient's anemia, the patient had initially presented     with an H&H of 10.6 and 32.8.  On subsequent days, this had  decreased     down to 8.6 and 26.2 by May 21.  Fecal hemoccult exam was done and the     patient was noted to be slightly heme positive.  However, a repeat H&H     done on May 24, three days later, had only shown a mild decrease at 8.5     and 26.1.  Iron studies were checked and the patient was found to have a     low iron, low TIBC and normal ferritin.  It was felt that likely she had     some form of either chronic disease or evidence of thalassemia.  He     advised that given her normal BUN and creatinine that her primary care     doctor followup on this consultation may benefit from erythropoietin     shots if she is found to have further decrease in her anemia.  Currently,     she is not orthostatic.  Her blood pressure and vitals are stable and she     shows no evidence of any acute bleeding.   PLAN:  Discharge the patient to home.  Her activity will be no heavy  exertion x1 weeks.  She is to be on a low-sodium diet.  She will followup  with her PCP, Dr. Barton Fanny at [facility] in 1- 2 weeks.   DISPOSITION:  Improved.  She is being discharged to home.                                                Hollice Espy, M.D.    SKK/MEDQ  D:  10/09/2003  T:  10/10/2003  Job:  [id number]   cc:   Fanny Dance. Rankins, M.D.  [address]  [place]  [place] [address]  Fax: [phone]

## 2010-10-03 NOTE — Group Therapy Note (Signed)
NAMEKAMEREN, BAADE NO.:  [id number]   MEDICAL RECORD NO.:  [id number]          PATIENT TYPE:  WOC   LOCATION:  [facility]                   FACILITY:  [facility]   PHYSICIAN:  Elsie Lincoln, MD      DATE OF BIRTH:  1965-10-17   DATE OF SERVICE:  02/10/2006                                    CLINIC NOTE   The patient is a 45 year old G-2, P-2, 0-0-2 female, LMP 01/28/2006 who  [facility] for unable to visualize cervix.  The patient also has some complaints of urinary incontinence.  Sometimes it  is urge incontinence, sometimes it is after coughing.  The patient states  that the amount that she dribbles does not bother her and does not want  treatment.  She has regular periods and is sexually active and does not have  postcoital spotting.  Her periods are moderate flow with normal pain.  She  says she has regular cycles.  She really has no complaints today except for  she needs to get a Pap smear.   PAST MEDICAL HISTORY:  Hypertension and glaucoma.   PAST SURGICAL HISTORY:  BTL.   PAST GYN HISTORY:  Last Pap smear two to three years ago.  Two NSVD's,  5  pounds 7 ounces and 6 pounds 9 ounces.  She had normal Pap smears, ovarian  cysts, fibroid tumors or sexually transmitted diseases.   FAMILY HISTORY:  Mother, two sisters and a brother have diabetes.  Grandmother has heart disease.  Grandfather and brother have had a heart  attack.  No first degree relatives have cancer.  Her mother has been on  dialysis.   ALLERGIES:  TYLOX.   MEDICATIONS:  Atenolol, __________ Cosopt eye drops, Allegra and Naprosyn.   REVIEW OF SYSTEMS:  Positive for numbness in the finger sometimes,  fevers  or night sweats, frequent headaches, dizzy spells and hot flashes.   SOCIAL HISTORY:  Smokes 6 to 7 cigarettes a day and drinks caffeinated  beverages.  She does not do drugs or alcohol or history of physical abuse.   PHYSICAL EXAM:  ABDOMEN:  Soft,  nontender, nondistended.  No rebound or  guarding.  GENITALIA:  Tanner five.  Vagina pink, normal rugae, small amount of  discharge.  Cervix cannot be visualized secondary to a large protruding mass  from the cervix that is pink and fleshy, consistent with a fibroid or polyp.  It Is more firm consistent with a fibroid.  Cervix is dilated to 2 to 3 cm.  The uterus fills normal size and anteverted.  Adnexa has no masses.  Rectovaginal exam is no nodularity.  The bladder cannot be well assessed for  a cystocele secondary to the large mass in the midline.  However, the  patient did not lose urine during the exam.   ASSESSMENT/PLAN:  A 45 year old female with most likely aborting fibroid or  polyp.  Will get transvaginal ultrasound to evaluate all structures and have  her return in a week for a plan of care.  Most likely will have removal of  the aborting fibroid in the  operating room.  The patient also needs a  mammogram scheduled.           ______________________________  Elsie Lincoln, MD     KL/MEDQ  D:  02/10/2006  T:  02/12/2006  Job:  [id number]

## 2013-03-25 ENCOUNTER — Encounter (HOSPITAL_COMMUNITY): Payer: Self-pay | Admitting: Emergency Medicine

## 2013-03-25 ENCOUNTER — Emergency Department (INDEPENDENT_AMBULATORY_CARE_PROVIDER_SITE_OTHER)
Admission: EM | Admit: 2013-03-25 | Discharge: 2013-03-25 | Disposition: A | Discharge status: Home / Self Care | Payer: BC Managed Care – PPO | Source: Home / Self Care | Attending: Family Medicine | Admitting: Family Medicine

## 2013-03-25 DIAGNOSIS — N949 Unspecified condition associated with female genital organs and menstrual cycle: Secondary | ICD-10-CM

## 2013-03-25 DIAGNOSIS — R102 Pelvic and perineal pain: Secondary | ICD-10-CM

## 2013-03-25 HISTORY — DX: Essential (primary) hypertension: I10

## 2013-03-25 HISTORY — DX: Unspecified glaucoma: H40.9

## 2013-03-25 HISTORY — DX: Anemia, unspecified: D64.9

## 2013-03-25 LAB — POCT URINALYSIS DIP (DEVICE)
Bilirubin Urine: NEGATIVE
Glucose, UA: NEGATIVE mg/dL
Hgb urine dipstick: NEGATIVE
Ketones, ur: NEGATIVE mg/dL
Nitrite: NEGATIVE
Protein, ur: NEGATIVE mg/dL
Specific Gravity, Urine: >=1.03 (ref 1.005–1.030)
pH: 6.5 (ref 5.0–8.0)

## 2013-03-25 NOTE — ED Notes (Signed)
Pt  Reports  Pain r  Side  rafdiating  Down  r  Lower quadrant      X  2  Days  denys  Any  Bleeding or  Other   Gyn  Problems       Walks  Upright  With a  Steady  Fluid  gait

## 2013-03-25 NOTE — ED Provider Notes (Signed)
CSN: [id number]     Arrival date & time 03/25/13  1631 History   First MD Initiated Contact with Patient 03/25/13 1702     Chief Complaint  Patient presents with  . Flank Pain   (Consider location/radiation/quality/duration/timing/severity/associated sxs/prior Treatment) Patient is a 47 y.o. female presenting with flank pain. The history is provided by the patient.  Flank Pain This is a new (s/p tubal lig.) problem. The current episode started 2 days ago. The problem has been gradually worsening. Associated symptoms include abdominal pain.    Past Medical History  Diagnosis Date  . Hypertension   . Glaucoma   . Anemia    History reviewed. No pertinent past surgical history. History reviewed. No pertinent family history. History  Substance Use Topics  . Smoking status: Current Every Day Smoker  . Smokeless tobacco: Not on file  . Alcohol Use: Yes     Comment: toddy  now  and  then   OB History   Grav Para Term Preterm Abortions TAB SAB Ect Mult Living                 Review of Systems  Constitutional: Negative.   Gastrointestinal: Positive for abdominal pain.  Genitourinary: Positive for pelvic pain. Negative for hematuria, flank pain, vaginal bleeding, vaginal discharge and menstrual problem.    Allergies  Tylox  Home Medications   Current Outpatient Rx  Name  Route  Sig  Dispense  Refill  . ATENOLOL PO   Oral   Take by mouth.         Marland Kitchen HYDROCHLOROTHIAZIDE PO   Oral   Take by mouth.          BP 152/82  Pulse 65  Temp(Src) 98 F (36.7 C) (Oral)  Resp 16  SpO2 100% Physical Exam  Nursing note and vitals reviewed. Constitutional: She is oriented to person, place, and time. She appears well-developed and well-nourished.  Abdominal: Soft. Normal appearance and bowel sounds are normal. There is tenderness in the suprapubic area. There is no rigidity, no rebound, no guarding and no CVA tenderness.    Neurological: She is alert and oriented to person,  place, and time.  Skin: Skin is warm and dry.    ED Course  Procedures (including critical care time) Labs Review Labs Reviewed  POCT URINALYSIS DIP (DEVICE)   Imaging Review No results found.  EKG Interpretation     Ventricular Rate:    PR Interval:    QRS Duration:   QT Interval:    QTC Calculation:   R Axis:     Text Interpretation:              MDM  Discussed with [facility], will see.    Linna Hoff, MD 03/25/13 [phone]

## 2013-03-25 NOTE — ED Notes (Signed)
Reported  To ginger    At  womens  mau        Pt  Advised  To  Remain npo  And  She  Will proceed  To  womens   mau

## 2013-04-25 ENCOUNTER — Ambulatory Visit: Payer: BC Managed Care – PPO | Attending: Internal Medicine | Admitting: Internal Medicine

## 2013-04-25 ENCOUNTER — Encounter: Payer: Self-pay | Admitting: Internal Medicine

## 2013-04-25 VITALS — BP 135/94 | HR 65 | Temp 98.0°F | Resp 16 | Ht 68.0 in | Wt 204.0 lb

## 2013-04-25 DIAGNOSIS — Z0000 Encounter for general adult medical examination without abnormal findings: Secondary | ICD-10-CM | POA: Insufficient documentation

## 2013-04-25 DIAGNOSIS — I10 Essential (primary) hypertension: Secondary | ICD-10-CM | POA: Insufficient documentation

## 2013-04-25 LAB — BASIC METABOLIC PANEL
CO2: 27 mEq/L (ref 19–32)
Calcium: 10 mg/dL (ref 8.4–10.5)
Creat: 0.86 mg/dL (ref 0.50–1.10)
Glucose, Bld: 83 mg/dL (ref 70–99)
Sodium: 135 mEq/L (ref 135–145)

## 2013-04-25 LAB — CBC WITH DIFFERENTIAL/PLATELET
Basophils Relative: 1 % (ref 0–1)
Eosinophils Absolute: 0.1 10*3/uL (ref 0.0–0.7)
Eosinophils Relative: 3 % (ref 0–5)
Lymphocytes Relative: 30 % (ref 12–46)
Lymphs Abs: 1.3 10*3/uL (ref 0.7–4.0)
MCH: 22.4 pg — ABNORMAL LOW (ref 26.0–34.0)
MCHC: 31.2 g/dL (ref 30.0–36.0)
MCV: 71.7 fL — ABNORMAL LOW (ref 78.0–100.0)
Monocytes Relative: 15 % — ABNORMAL HIGH (ref 3–12)
Neutrophils Relative %: 51 % (ref 43–77)
Platelets: 231 10*3/uL (ref 150–400)
RBC: 4.42 MIL/uL (ref 3.87–5.11)
WBC: 4.3 10*3/uL (ref 4.0–10.5)

## 2013-04-25 LAB — LIPID PANEL
Cholesterol: 154 mg/dL (ref 0–200)
HDL: 45 mg/dL (ref >39)
LDL Cholesterol: 92 mg/dL (ref 0–99)
Triglycerides: 86 mg/dL (ref <150)
VLDL: 17 mg/dL (ref 0–40)

## 2013-04-25 MED ORDER — POLYSACCHARIDE IRON COMPLEX 150 MG PO CAPS: 150.0000 mg | ORAL_CAPSULE | Freq: Every day | ORAL | Status: DC

## 2013-04-25 MED ORDER — HYDROCHLOROTHIAZIDE 25 MG PO TABS: 25.0000 mg | ORAL_TABLET | Freq: Every day | ORAL | Status: DC

## 2013-04-25 MED ORDER — ATENOLOL 100 MG PO TABS: 100.0000 mg | ORAL_TABLET | Freq: Every day | ORAL | Status: DC

## 2013-04-25 MED ORDER — FAMOTIDINE 20 MG PO TABS: 20.0000 mg | ORAL_TABLET | Freq: Every day | ORAL | Status: DC

## 2013-04-25 NOTE — Patient Instructions (Signed)

## 2013-04-25 NOTE — Progress Notes (Signed)
Patient ID: Connie Bryant, female   DOB: 01-05-1966, 47 y.o.   MRN: [id number]  CC: new pt  HPI: 47 year old female with past medical history of hypertension and iron deficiency anemia who presented to clinic to establish primary care physician. At this time pt only reports occasional pelvic pain but no abnormal vaginal discharge or bleeding.  Allergies  Allergen Reactions  . Tylox [Oxycodone-Acetaminophen]    Past Medical History  Diagnosis Date  . Hypertension   . Glaucoma   . Anemia    No current outpatient prescriptions on file prior to visit.   No current facility-administered medications on file prior to visit.   Family History  Problem Relation Age of Onset  . Diabetes Mother   . Kidney disease Mother   . Diabetes Sister   . Diabetes Brother   . Heart disease Brother    History   Social History  . Marital Status: Married    Spouse Name: N/A    Number of Children: N/A  . Years of Education: N/A   Occupational History  . Not on file.   Social History Main Topics  . Smoking status: Current Every Day Smoker  . Smokeless tobacco: Not on file  . Alcohol Use: Yes     Comment: toddy  now  and  then  . Drug Use: No  . Sexual Activity: Yes   Other Topics Concern  . Not on file   Social History Narrative  . No narrative on file    Review of Systems  Constitutional: Negative for fever, chills, diaphoresis, activity change, appetite change and fatigue.  HENT: Negative for ear pain, nosebleeds, congestion, facial swelling, rhinorrhea, neck pain, neck stiffness and ear discharge.   Eyes: Negative for pain, discharge, redness, itching and visual disturbance.  Respiratory: Negative for cough, choking, chest tightness, shortness of breath, wheezing and stridor.   Cardiovascular: Negative for chest pain, palpitations and leg swelling.  Gastrointestinal: Negative for abdominal distention.  Genitourinary: Negative for dysuria, urgency, frequency, hematuria, flank  pain, decreased urine volume, difficulty urinating and dyspareunia.  Musculoskeletal: Negative for back pain, joint swelling, arthralgias and gait problem.  Neurological: Negative for dizziness, tremors, seizures, syncope, facial asymmetry, speech difficulty, weakness, light-headedness, numbness and headaches.  Hematological: Negative for adenopathy. Does not bruise/bleed easily.  Psychiatric/Behavioral: Negative for hallucinations, behavioral problems, confusion, dysphoric mood, decreased concentration and agitation.    Objective:   Filed Vitals:   04/25/13 1412  BP: 135/94  Pulse: 65  Temp: 98 F (36.7 C)  Resp: 16    Physical Exam  Constitutional: Appears well-developed and well-nourished. No distress.  HENT: Normocephalic. External right and left ear normal. Oropharynx is clear and moist.  Eyes: Conjunctivae and EOM are normal. PERRLA, no scleral icterus.  Neck: Normal ROM. Neck supple. No JVD. No tracheal deviation. No thyromegaly.  CVS: RRR, S1/S2 +, no murmurs, no gallops, no carotid bruit.  Pulmonary: Effort and breath sounds normal, no stridor, rhonchi, wheezes, rales.  Abdominal: Soft. BS +,  no distension, tenderness, rebound or guarding.  Musculoskeletal: Normal range of motion. No edema and no tenderness.  Lymphadenopathy: No lymphadenopathy noted, cervical, inguinal. Neuro: Alert. Normal reflexes, muscle tone coordination. No cranial nerve deficit. Skin: Skin is warm and dry. No rash noted. Not diaphoretic. No erythema. No pallor.  Psychiatric: Normal mood and affect. Behavior, judgment, thought content normal.   No results found for this basename: WBC, HGB, HCT, MCV, PLT   No results found for this basename: CREATININE, BUN, NA,  K, CL, CO2    No results found for this basename: HGBA1C   Lipid Panel  No results found for this basename: chol, trig, hdl, cholhdl, vldl, ldlcalc       Assessment and plan:   Patient Active Problem List   Diagnosis Date Noted   . HTN (hypertension) 04/25/2013    Priority: High - We have discussed target BP range - I have advised pt to check BP regularly and to call us back if the numbers are higher than 140/90 - discussed the importance of compliance with medical therapy and diet  - continue atenolol 100 mg daily and Hctz 25 mg daily  . Preventative health care 04/25/2013    Priority: High - GYN referral - pelvic US - screening mammogram - declined flu shot

## 2013-04-25 NOTE — Progress Notes (Signed)
Pt is here to establish care. Pt as pain in the abdomen after her cycles and after sex.

## 2013-04-28 ENCOUNTER — Ambulatory Visit (HOSPITAL_COMMUNITY): Payer: BC Managed Care – PPO

## 2013-05-02 ENCOUNTER — Ambulatory Visit (HOSPITAL_COMMUNITY): Admission: RE | Admit: 2013-05-02 | Payer: BC Managed Care – PPO | Source: Ambulatory Visit

## 2013-05-10 ENCOUNTER — Other Ambulatory Visit: Payer: Self-pay | Admitting: Internal Medicine

## 2013-05-10 ENCOUNTER — Ambulatory Visit (HOSPITAL_COMMUNITY)
Admission: RE | Admit: 2013-05-10 | Discharge: 2013-05-10 | Disposition: A | Discharge status: Home / Self Care | Payer: BC Managed Care – PPO | Source: Ambulatory Visit | Attending: Internal Medicine | Admitting: Internal Medicine

## 2013-05-10 DIAGNOSIS — Z0000 Encounter for general adult medical examination without abnormal findings: Secondary | ICD-10-CM

## 2013-05-10 DIAGNOSIS — N83209 Unspecified ovarian cyst, unspecified side: Secondary | ICD-10-CM | POA: Insufficient documentation

## 2013-05-10 DIAGNOSIS — N949 Unspecified condition associated with female genital organs and menstrual cycle: Secondary | ICD-10-CM | POA: Insufficient documentation

## 2013-05-10 DIAGNOSIS — D259 Leiomyoma of uterus, unspecified: Secondary | ICD-10-CM | POA: Insufficient documentation

## 2013-05-16 ENCOUNTER — Telehealth: Payer: Self-pay | Admitting: Internal Medicine

## 2013-05-16 NOTE — Telephone Encounter (Signed)
Please f/u. Pt requesting [place] results

## 2013-05-16 NOTE — Telephone Encounter (Signed)
Pt would like the results of her pelvic exam explained to her; please f/u with pt

## 2013-05-26 ENCOUNTER — Telehealth: Payer: Self-pay

## 2013-05-26 DIAGNOSIS — D219 Benign neoplasm of connective and other soft tissue, unspecified: Secondary | ICD-10-CM

## 2013-05-26 NOTE — Telephone Encounter (Signed)
Spoke with patient she is aware of her lab and [place] results States she is already taking iron Will put in referral to gynecology

## 2013-06-14 ENCOUNTER — Encounter: Payer: BC Managed Care – PPO | Admitting: Obstetrics & Gynecology

## 2013-06-14 ENCOUNTER — Ambulatory Visit (HOSPITAL_COMMUNITY)
Admission: RE | Admit: 2013-06-14 | Discharge: 2013-06-14 | Disposition: A | Discharge status: Home / Self Care | Payer: BC Managed Care – PPO | Source: Ambulatory Visit | Attending: Internal Medicine | Admitting: Internal Medicine

## 2013-06-14 DIAGNOSIS — Z1231 Encounter for screening mammogram for malignant neoplasm of breast: Secondary | ICD-10-CM | POA: Insufficient documentation

## 2013-06-14 DIAGNOSIS — Z0000 Encounter for general adult medical examination without abnormal findings: Secondary | ICD-10-CM

## 2013-06-15 ENCOUNTER — Other Ambulatory Visit: Payer: Self-pay | Admitting: Internal Medicine

## 2013-06-15 DIAGNOSIS — R928 Other abnormal and inconclusive findings on diagnostic imaging of breast: Secondary | ICD-10-CM

## 2013-06-16 ENCOUNTER — Telehealth: Payer: Self-pay

## 2013-06-16 NOTE — Telephone Encounter (Signed)
Dr Charlies Silvers wanted me to follow up with patient in reference  To mass in her breast and make her an appointment here ASAP The number on file  Does not accept incoming calls Unable to leave message

## 2013-06-16 NOTE — Telephone Encounter (Signed)
Got in touch with patient Discussed with patient that she needed to get in contact with the  Breast center to have a follow up for possible mass in left breast She agreed and will  Call them today

## 2013-06-26 ENCOUNTER — Ambulatory Visit
Admission: RE | Admit: 2013-06-26 | Discharge: 2013-06-26 | Disposition: A | Discharge status: Home / Self Care | Payer: Self-pay | Source: Ambulatory Visit | Attending: Internal Medicine | Admitting: Internal Medicine

## 2013-06-26 ENCOUNTER — Ambulatory Visit
Admission: RE | Admit: 2013-06-26 | Discharge: 2013-06-26 | Disposition: A | Discharge status: Home / Self Care | Payer: BC Managed Care – PPO | Source: Ambulatory Visit | Attending: Internal Medicine | Admitting: Internal Medicine

## 2013-06-26 DIAGNOSIS — R928 Other abnormal and inconclusive findings on diagnostic imaging of breast: Secondary | ICD-10-CM

## 2013-07-24 ENCOUNTER — Encounter: Payer: Self-pay | Admitting: Internal Medicine

## 2013-07-24 ENCOUNTER — Ambulatory Visit: Payer: BC Managed Care – PPO | Attending: Internal Medicine | Admitting: Internal Medicine

## 2013-07-24 VITALS — BP 142/88 | HR 78 | Temp 98.6°F | Resp 16 | Ht 67.5 in | Wt 207.0 lb

## 2013-07-24 DIAGNOSIS — R51 Headache: Secondary | ICD-10-CM | POA: Insufficient documentation

## 2013-07-24 DIAGNOSIS — N920 Excessive and frequent menstruation with regular cycle: Secondary | ICD-10-CM | POA: Diagnosis not present

## 2013-07-24 DIAGNOSIS — N946 Dysmenorrhea, unspecified: Secondary | ICD-10-CM | POA: Insufficient documentation

## 2013-07-24 DIAGNOSIS — R21 Rash and other nonspecific skin eruption: Secondary | ICD-10-CM | POA: Insufficient documentation

## 2013-07-24 DIAGNOSIS — I10 Essential (primary) hypertension: Secondary | ICD-10-CM | POA: Insufficient documentation

## 2013-07-24 LAB — CBC WITH DIFFERENTIAL/PLATELET
Basophils Absolute: 0 10*3/uL (ref 0.0–0.1)
Basophils Relative: 1 % (ref 0–1)
EOS PCT: 3 % (ref 0–5)
Eosinophils Absolute: 0.1 10*3/uL (ref 0.0–0.7)
HCT: 28.8 % — ABNORMAL LOW (ref 36.0–46.0)
HEMOGLOBIN: 8.9 g/dL — AB (ref 12.0–15.0)
LYMPHS ABS: 0.9 10*3/uL (ref 0.7–4.0)
Lymphocytes Relative: 19 % (ref 12–46)
MCH: 21.2 pg — AB (ref 26.0–34.0)
MCHC: 30.9 g/dL (ref 30.0–36.0)
MCV: 68.6 fL — ABNORMAL LOW (ref 78.0–100.0)
MONOS PCT: 11 % (ref 3–12)
Monocytes Absolute: 0.5 10*3/uL (ref 0.1–1.0)
Neutro Abs: 3.2 10*3/uL (ref 1.7–7.7)
Neutrophils Relative %: 66 % (ref 43–77)
PLATELETS: 257 10*3/uL (ref 150–400)
RBC: 4.2 MIL/uL (ref 3.87–5.11)
RDW: 20.1 % — ABNORMAL HIGH (ref 11.5–15.5)
WBC: 4.8 10*3/uL (ref 4.0–10.5)

## 2013-07-24 LAB — COMPLETE METABOLIC PANEL WITH GFR
ALT: 8 U/L (ref 0–35)
AST: 14 U/L (ref 0–37)
Albumin: 3.8 g/dL (ref 3.5–5.2)
Alkaline Phosphatase: 54 U/L (ref 39–117)
BILIRUBIN TOTAL: 0.3 mg/dL (ref 0.2–1.2)
BUN: 12 mg/dL (ref 6–23)
CO2: 23 meq/L (ref 19–32)
CREATININE: 0.77 mg/dL (ref 0.50–1.10)
Calcium: 8.8 mg/dL (ref 8.4–10.5)
Chloride: 106 mEq/L (ref 96–112)
GLUCOSE: 91 mg/dL (ref 70–99)
Potassium: 4 mEq/L (ref 3.5–5.3)
Sodium: 135 mEq/L (ref 135–145)
Total Protein: 7.4 g/dL (ref 6.0–8.3)

## 2013-07-24 MED ORDER — HYDROCHLOROTHIAZIDE 25 MG PO TABS: 25.0000 mg | ORAL_TABLET | Freq: Every day | ORAL | Status: DC

## 2013-07-24 MED ORDER — TRAMADOL HCL 50 MG PO TABS: 50.0000 mg | ORAL_TABLET | Freq: Three times a day (TID) | ORAL | Status: DC | PRN

## 2013-07-24 MED ORDER — HYDROXYZINE HCL 25 MG PO TABS: 25.0000 mg | ORAL_TABLET | Freq: Three times a day (TID) | ORAL | Status: DC | PRN

## 2013-07-24 MED ORDER — FAMOTIDINE 20 MG PO TABS: 20.0000 mg | ORAL_TABLET | Freq: Every day | ORAL | Status: DC

## 2013-07-24 MED ORDER — HYDROCORTISONE 0.5 % EX OINT: 1.0000 "application " | TOPICAL_OINTMENT | Freq: Two times a day (BID) | CUTANEOUS | Status: DC

## 2013-07-24 MED ORDER — POLYSACCHARIDE IRON COMPLEX 150 MG PO CAPS: 150.0000 mg | ORAL_CAPSULE | Freq: Every day | ORAL | Status: DC

## 2013-07-24 MED ORDER — BUTALBITAL-APAP-CAFFEINE 50-325-40 MG PO TABS: 1.0000 | ORAL_TABLET | Freq: Three times a day (TID) | ORAL | Status: AC | PRN

## 2013-07-24 MED ORDER — ATENOLOL 100 MG PO TABS: 100.0000 mg | ORAL_TABLET | Freq: Every day | ORAL | Status: DC

## 2013-07-24 NOTE — Progress Notes (Signed)
Pt is here following up on her frequent headaches.

## 2013-07-24 NOTE — Progress Notes (Signed)
Patient ID: Connie Bryant, female   DOB: 1965/10/09, 48 y.o.   MRN: [id number]   CC:  HPI: Patient here with a headache, started yesterday, she reports 3 headaches a month. Sometimes in the past her menstrual cycle. The patient has a history of hypertension and anemic from heavy menstrual cycle due to fibroids. She takes Advil for headaches. She does describe some photophobia with the onset of the headache usually lasts one to 2 days. She states that she had an ultrasound of her pelvis that showed fibroids. She continues to bleed heavily.  She denies any epigastric pain, chest pain, shortness of breath She also describes itching rash on her dorsal aspect of both hands. She uses about two scented, lotions on her hand. The rash is just localized to her hand.  Allergies  Allergen Reactions  . Tylox [Oxycodone-Acetaminophen]    Past Medical History  Diagnosis Date  . Hypertension   . Glaucoma   . Anemia    No current outpatient prescriptions on file prior to visit.   No current facility-administered medications on file prior to visit.   Family History  Problem Relation Age of Onset  . Diabetes Mother   . Kidney disease Mother   . Diabetes Sister   . Diabetes Brother   . Heart disease Brother    History   Social History  . Marital Status: Married    Spouse Name: N/A    Number of Children: N/A  . Years of Education: N/A   Occupational History  . Not on file.   Social History Main Topics  . Smoking status: Current Every Day Smoker  . Smokeless tobacco: Not on file  . Alcohol Use: Yes     Comment: toddy  now  and  then  . Drug Use: No  . Sexual Activity: Yes   Other Topics Concern  . Not on file   Social History Narrative  . No narrative on file    Review of Systems  Constitutional: Negative for fever, chills, diaphoresis, activity change, appetite change and fatigue.  HENT: Negative for ear pain, nosebleeds, congestion, facial swelling, rhinorrhea, neck pain,  neck stiffness and ear discharge.   Eyes: Negative for pain, discharge, redness, itching and visual disturbance.  Respiratory: Negative for cough, choking, chest tightness, shortness of breath, wheezing and stridor.   Cardiovascular: Negative for chest pain, palpitations and leg swelling.  Gastrointestinal: Negative for abdominal distention.  Genitourinary: Negative for dysuria, urgency, frequency, hematuria, flank pain, decreased urine volume, difficulty urinating and dyspareunia.  Musculoskeletal: Negative for back pain, joint swelling, arthralgias and gait problem.  Neurological: Negative for dizziness, tremors, seizures, syncope, facial asymmetry, speech difficulty, weakness, light-headedness, numbness and headaches.  Hematological: Negative for adenopathy. Does not bruise/bleed easily.  Psychiatric/Behavioral: Negative for hallucinations, behavioral problems, confusion, dysphoric mood, decreased concentration and agitation.    Objective:   Filed Vitals:   07/24/13 1442  BP: 142/88  Pulse: 78  Temp: 98.6 F (37 C)  Resp: 16    Physical Exam  Constitutional: Appears well-developed and well-nourished. No distress.  HENT: Normocephalic. External right and left ear normal. Oropharynx is clear and moist.  Eyes: Conjunctivae and EOM are normal. PERRLA, no scleral icterus.  Neck: Normal ROM. Neck supple. No JVD. No tracheal deviation. No thyromegaly.  CVS: RRR, S1/S2 +, no murmurs, no gallops, no carotid bruit.  Pulmonary: Effort and breath sounds normal, no stridor, rhonchi, wheezes, rales.  Abdominal: Soft. BS +,  no distension, tenderness, rebound or guarding.  Musculoskeletal: Normal range of motion. No edema and no tenderness.  Lymphadenopathy: No lymphadenopathy noted, cervical, inguinal. Neuro: Alert. Normal reflexes, muscle tone coordination. No cranial nerve deficit. Skin papular rash on the dorsal aspect of both hands  Psychiatric: Normal mood and affect. Behavior, judgment,  thought content normal.   Lab Results  Component Value Date   WBC 4.3 04/25/2013   HGB 9.9* 04/25/2013   HCT 31.7* 04/25/2013   MCV 71.7* 04/25/2013   PLT 231 04/25/2013   Lab Results  Component Value Date   CREATININE 0.86 04/25/2013   BUN 11 04/25/2013   NA 135 04/25/2013   K 4.0 04/25/2013   CL 100 04/25/2013   CO2 27 04/25/2013    Lab Results  Component Value Date   HGBA1C 5.2 04/25/2013   Lipid Panel     Component Value Date/Time   CHOL 154 04/25/2013 1439   TRIG 86 04/25/2013 1439   HDL 45 04/25/2013 1439   CHOLHDL 3.4 04/25/2013 1439   VLDL 17 04/25/2013 1439   LDLCALC 92 04/25/2013 1439       Assessment and plan:   Patient Active Problem List   Diagnosis Date Noted  . HTN (hypertension) 04/25/2013  . Preventative health care 04/25/2013   Rash Patient has been instructed to stop using hand lotion, and try using unscented moisturizers aloe vera Also started the patient on hydroxyzine for itching and hydrocortisone ointment   Hypertension Refills of atenolol and HCTZ  Headache Most likely migraine could be exacerbated by anemia/hypertension Start the patient on Fioricet, tramadol  Dysmenorrhea/menorrhagia Gynecology referral for fibroids CBC  Patient to follow up in 2 months      The patient was given clear instructions to go to ER or return to medical center if symptoms don't improve, worsen or new problems develop. The patient verbalized understanding. The patient was told to call to get any lab results if not heard anything in the next week.

## 2013-07-26 ENCOUNTER — Encounter: Payer: Self-pay | Admitting: Obstetrics & Gynecology

## 2013-08-11 ENCOUNTER — Telehealth: Payer: Self-pay | Admitting: Internal Medicine

## 2013-08-11 ENCOUNTER — Other Ambulatory Visit: Payer: Self-pay

## 2013-08-11 ENCOUNTER — Ambulatory Visit: Payer: BC Managed Care – PPO | Attending: Internal Medicine

## 2013-08-11 DIAGNOSIS — D649 Anemia, unspecified: Secondary | ICD-10-CM

## 2013-08-11 LAB — CBC WITH DIFFERENTIAL/PLATELET
BASOS PCT: 1 % (ref 0–1)
Basophils Absolute: 0.1 10*3/uL (ref 0.0–0.1)
Eosinophils Absolute: 0.1 10*3/uL (ref 0.0–0.7)
Eosinophils Relative: 2 % (ref 0–5)
HCT: 27.8 % — ABNORMAL LOW (ref 36.0–46.0)
HEMOGLOBIN: 8.5 g/dL — AB (ref 12.0–15.0)
Lymphocytes Relative: 23 % (ref 12–46)
Lymphs Abs: 1.3 10*3/uL (ref 0.7–4.0)
MCH: 20.7 pg — ABNORMAL LOW (ref 26.0–34.0)
MCHC: 30.6 g/dL (ref 30.0–36.0)
MCV: 67.8 fL — ABNORMAL LOW (ref 78.0–100.0)
MONOS PCT: 14 % — AB (ref 3–12)
Monocytes Absolute: 0.8 10*3/uL (ref 0.1–1.0)
NEUTROS PCT: 60 % (ref 43–77)
Neutro Abs: 3.4 10*3/uL (ref 1.7–7.7)
PLATELETS: 308 10*3/uL (ref 150–400)
RBC: 4.1 MIL/uL (ref 3.87–5.11)
RDW: 20 % — ABNORMAL HIGH (ref 11.5–15.5)
WBC: 5.6 10*3/uL (ref 4.0–10.5)

## 2013-08-11 MED ORDER — IBUPROFEN 600 MG PO TABS: 600.0000 mg | ORAL_TABLET | Freq: Three times a day (TID) | ORAL | Status: DC | PRN

## 2013-08-11 NOTE — Telephone Encounter (Signed)
Pt is scheduled to come in today for repeat CBC Medication ordered for Ibuprofen 600 mg tab

## 2013-08-11 NOTE — Telephone Encounter (Signed)
Pt has appt GYN referral on 09/04/13 but is still having excessive menstruation and would like know what to do until then since she is feeling weak.

## 2013-08-12 ENCOUNTER — Encounter (HOSPITAL_COMMUNITY): Payer: Self-pay

## 2013-08-12 ENCOUNTER — Inpatient Hospital Stay (HOSPITAL_COMMUNITY)
Admission: AD | Admit: 2013-08-12 | Discharge: 2013-08-12 | Disposition: A | Discharge status: Home / Self Care | Payer: BC Managed Care – PPO | Source: Ambulatory Visit | Attending: Obstetrics & Gynecology | Admitting: Obstetrics & Gynecology

## 2013-08-12 DIAGNOSIS — N938 Other specified abnormal uterine and vaginal bleeding: Secondary | ICD-10-CM

## 2013-08-12 DIAGNOSIS — D259 Leiomyoma of uterus, unspecified: Secondary | ICD-10-CM | POA: Insufficient documentation

## 2013-08-12 DIAGNOSIS — N949 Unspecified condition associated with female genital organs and menstrual cycle: Secondary | ICD-10-CM

## 2013-08-12 DIAGNOSIS — F17200 Nicotine dependence, unspecified, uncomplicated: Secondary | ICD-10-CM | POA: Insufficient documentation

## 2013-08-12 DIAGNOSIS — N83209 Unspecified ovarian cyst, unspecified side: Secondary | ICD-10-CM | POA: Insufficient documentation

## 2013-08-12 DIAGNOSIS — D649 Anemia, unspecified: Secondary | ICD-10-CM

## 2013-08-12 LAB — URINALYSIS, ROUTINE W REFLEX MICROSCOPIC
Bilirubin Urine: NEGATIVE
GLUCOSE, UA: NEGATIVE mg/dL
Ketones, ur: NEGATIVE mg/dL
Leukocytes, UA: NEGATIVE
NITRITE: NEGATIVE
PH: 5 (ref 5.0–8.0)
PROTEIN: 30 mg/dL — AB
SPECIFIC GRAVITY, URINE: 1.025 (ref 1.005–1.030)
UROBILINOGEN UA: 0.2 mg/dL (ref 0.0–1.0)

## 2013-08-12 LAB — CBC
HCT: 28.2 % — ABNORMAL LOW (ref 36.0–46.0)
Hemoglobin: 8.4 g/dL — ABNORMAL LOW (ref 12.0–15.0)
MCH: 21 pg — AB (ref 26.0–34.0)
MCHC: 29.8 g/dL — ABNORMAL LOW (ref 30.0–36.0)
MCV: 70.5 fL — ABNORMAL LOW (ref 78.0–100.0)
Platelets: 234 10*3/uL (ref 150–400)
RBC: 4 MIL/uL (ref 3.87–5.11)
RDW: 19.6 % — ABNORMAL HIGH (ref 11.5–15.5)
WBC: 4.1 10*3/uL (ref 4.0–10.5)

## 2013-08-12 LAB — POCT PREGNANCY, URINE: Preg Test, Ur: NEGATIVE

## 2013-08-12 LAB — URINE MICROSCOPIC-ADD ON

## 2013-08-12 MED ORDER — MEGESTROL ACETATE 40 MG PO TABS: 40.0000 mg | ORAL_TABLET | Freq: Two times a day (BID) | ORAL | Status: DC

## 2013-08-12 NOTE — Discharge Instructions (Signed)
Abnormal Uterine Bleeding Abnormal uterine bleeding means bleeding from the vagina that is not your normal menstrual period. This can be:  Bleeding or spotting between periods.  Bleeding after sex (sexual intercourse).  Bleeding that is heavier or more than normal.  Periods that last longer than usual.  Bleeding after menopause. There are many problems that may cause this. Treatment will depend on the cause of the bleeding. Any kind of bleeding that is not normal should be reviewed by your doctor.  HOME CARE Watch your condition for any changes. These actions may lessen any discomfort you are having:  Do not use tampons or douches as told by your doctor.  Change your pads often. You should get regular pelvic exams and Pap tests. Keep all appointments for tests as told by your doctor. GET HELP IF:  You are bleeding for more than 1 week.  You feel dizzy at times. GET HELP RIGHT AWAY IF:   You pass out.  You have to change pads every 15 to 30 minutes.  You have belly pain.  You have a fever.  You become sweaty or weak.  You are passing large blood clots from the vagina.  You feel sick to your stomach (nauseous) and throw up (vomit). MAKE SURE YOU:  Understand these instructions.  Will watch your condition.  Will get help right away if you are not doing well or get worse. Document Released: 03/01/2009 Document Revised: 02/22/2013 Document Reviewed: 12/01/2012 [employer] Patient Information 2014 [employer].  

## 2013-08-12 NOTE — MAU Provider Note (Signed)
Attestation of Attending Supervision of Advanced Practitioner (PA/CNM/NP): Evaluation and management procedures were performed by the Advanced Practitioner under my supervision and collaboration.  I have reviewed the Advanced Practitioner's note and chart, and I agree with the management and plan.  Mohammedali Bedoy, MD, FACOG Attending Obstetrician & Gynecologist Faculty Practice, [facility]  

## 2013-08-12 NOTE — MAU Note (Signed)
Pt presents complaining vaginal bleeding since 3/21. States she had a normal period 3/11-3/19 and the bleeding restarted after intercourse on 3/21.

## 2013-08-12 NOTE — MAU Provider Note (Signed)
History     CSN: [id number]  Arrival date and time: 08/12/13 1334   First Provider Initiated Contact with Patient 08/12/13 1407      Chief Complaint  Patient presents with  . Vaginal Bleeding   Vaginal Bleeding Associated symptoms include abdominal pain. Pertinent negatives include no chills, constipation, diarrhea, dysuria, fever, nausea or vomiting.   THIS PATIENT HAS A DUPLICATE CHART UNDER A DIFFERENT MR NUMBER: MRN: [id number]  This is a 48 y.o. female who presents with c/o bleeding which started 3/21, 3 days after completing her normal cycle on 3/11-19.  Soaks pad in 2-3 hrs.  Has some cramping with it. Has known fibroids and anemia. Treated with iron. Has GYN referral appt on 3/20. Wants bleedng to stop.  States had [place] in Dec and CBC yesterday, but no results were found.   After I reordered CBC, I found she has a Chart Merge planned and has a duplicate chart as above, which contains more recent data.   RN Note:   Pt presents complaining vaginal bleeding since 3/21. States she had a normal period 3/11-3/19 and the bleeding restarted after intercourse on 3/21.       OB History   Grav Para Term Preterm Abortions TAB SAB Ect Mult Living   2 2              Past Medical History  Diagnosis Date  . Anemia     History reviewed. No pertinent past surgical history.  History reviewed. No pertinent family history.  History  Substance Use Topics  . Smoking status: Current Every Day Smoker  . Smokeless tobacco: Not on file  . Alcohol Use: No    Allergies:  Allergies  Allergen Reactions  . Oxycodone-Acetaminophen     No prescriptions prior to admission    Review of Systems  Constitutional: Negative for fever, chills and malaise/fatigue.  Gastrointestinal: Positive for abdominal pain. Negative for nausea, vomiting, diarrhea and constipation.  Genitourinary: Positive for vaginal bleeding. Negative for dysuria.       Vaginal bleeding  Neurological: Negative for  dizziness.   Physical Exam   Blood pressure 152/84, pulse 56, temperature 97.8 F (36.6 C), temperature source Oral, resp. rate 18, last menstrual period 07/26/2013.  Physical Exam  Constitutional: She appears well-developed and well-nourished. No distress.  Cardiovascular: Normal rate.   Respiratory: Effort normal.  GI: Soft. She exhibits no distension. There is no tenderness. There is no rebound and no guarding.  Genitourinary: Vaginal discharge (scant blood) found.  Internal pelvic deferred due to recent exam and upcoming GYN appt  Musculoskeletal: Normal range of motion.    MAU Course  Procedures  MDM [place] from 2014  FINDINGS:  Uterus  Measurements: 10.4 x 5.6 x 7.5 cm. 2.6 cm maximum diameter  hypoechoic nodular density noted in the anterior aspect of the  uterus most likely fibroid. A 1.3 cm maximum diameter hypoechoic  nodular density in the right lateral portion of the of upper portion  of the uterus also most likely a fibroid.  Endometrium  Thickness: 14 mm. No focal abnormality visualized.  Right ovary  Measurements: 3.0 x 2.1 x 2.6 cm. Normal appearance/no adnexal mass.  Left ovary  Measurements: 3.1 x 2.6 x 2.8 cm. 2.2 x 1.8 x 1.6 cm simple left  ovarian cyst.  Other findings  No free fluid.  IMPRESSION:  1. Fibroid uterus.  2. 2.2 x 1.8 x 1.6 cm simple left ovarian cyst. This is almost  certainly benign, but follow  up ultrasound is recommended in 1 year  according to the Society of Radiologists in [place] Statement (D Clovis Riley et al. Management of Asymptomatic  Ovarian and Other Adnexal Cysts Imaged at [place]: Society of  Radiologists in [place] Statement 2010.  Radiology 256 (Sept 2010): 258-527.).  Last office Hgb = Results for ASHAUNTI, TREPTOW (MRN [id number]) as of 08/12/2013 14:23  Ref. Range 08/11/2013 12:59  Hemoglobin Latest Range: 12.0-15.0 g/dL 8.5 (L)   Assessment and Plan  A:  Dysfunctional  Uterine Bleeding       Anemia  P:  Discharge home       Has appt in clinic        Rx Megace bid x 3 days then q day  [facility] 08/12/2013, 2:27 PM

## 2013-08-14 ENCOUNTER — Encounter (HOSPITAL_COMMUNITY): Payer: Self-pay

## 2013-09-04 ENCOUNTER — Encounter: Payer: Self-pay | Admitting: Obstetrics & Gynecology

## 2013-09-04 ENCOUNTER — Ambulatory Visit (INDEPENDENT_AMBULATORY_CARE_PROVIDER_SITE_OTHER): Payer: BC Managed Care – PPO | Admitting: Obstetrics & Gynecology

## 2013-09-04 VITALS — BP 131/85 | HR 78 | Temp 97.9°F | Ht 68.0 in | Wt 206.5 lb

## 2013-09-04 DIAGNOSIS — D259 Leiomyoma of uterus, unspecified: Secondary | ICD-10-CM

## 2013-09-04 DIAGNOSIS — D219 Benign neoplasm of connective and other soft tissue, unspecified: Secondary | ICD-10-CM

## 2013-09-04 DIAGNOSIS — D649 Anemia, unspecified: Secondary | ICD-10-CM

## 2013-09-04 DIAGNOSIS — N925 Other specified irregular menstruation: Secondary | ICD-10-CM

## 2013-09-04 DIAGNOSIS — Z0000 Encounter for general adult medical examination without abnormal findings: Secondary | ICD-10-CM

## 2013-09-04 DIAGNOSIS — N938 Other specified abnormal uterine and vaginal bleeding: Secondary | ICD-10-CM

## 2013-09-04 DIAGNOSIS — N949 Unspecified condition associated with female genital organs and menstrual cycle: Secondary | ICD-10-CM

## 2013-09-04 NOTE — Patient Instructions (Signed)
Hysterectomy Information  A hysterectomy is a surgery in which your uterus is removed. This surgery may be done to treat various medical problems. After the surgery, you will no longer have menstrual periods. The surgery will also make you unable to become pregnant (sterile). The fallopian tubes and ovaries can be removed (bilateral salpingo-oophorectomy) during this surgery as well.  REASONS FOR A HYSTERECTOMY  Persistent, abnormal bleeding.  Lasting (chronic) pelvic pain or infection.  The lining of the uterus (endometrium) starts growing outside the uterus (endometriosis).  The endometrium starts growing in the muscle of the uterus (adenomyosis).  The uterus falls down into the vagina (pelvic organ prolapse).  Noncancerous growths in the uterus (uterine fibroids) that cause symptoms.  Precancerous cells.  Cervical cancer or uterine cancer. TYPES OF HYSTERECTOMIES  Supracervical hysterectomy In this type, the top part of the uterus is removed, but not the cervix.  Total hysterectomy The uterus and cervix are removed.  Radical hysterectomy The uterus, the cervix, and the fibrous tissue that holds the uterus in place in the pelvis (parametrium) are removed. WAYS A HYSTERECTOMY CAN BE PERFORMED  Abdominal hysterectomy A large surgical cut (incision) is made in the abdomen. The uterus is removed through this incision.  Vaginal hysterectomy An incision is made in the vagina. The uterus is removed through this incision. There are no abdominal incisions.  Conventional laparoscopic hysterectomy Three or four small incisions are made in the abdomen. A thin, lighted tube with a camera (laparoscope) is inserted into one of the incisions. Other tools are put through the other incisions. The uterus is cut into small pieces. The small pieces are removed through the incisions, or they are removed through the vagina.  Laparoscopically assisted vaginal hysterectomy (LAVH) Three or four small  incisions are made in the abdomen. Part of the surgery is performed laparoscopically and part vaginally. The uterus is removed through the vagina.  Robot-assisted laparoscopic hysterectomy A laparoscope and other tools are inserted into 3 or 4 small incisions in the abdomen. A computer-controlled device is used to give the surgeon a 3D image and to help control the surgical instruments. This allows for more precise movements of surgical instruments. The uterus is cut into small pieces and removed through the incisions or removed through the vagina. RISKS AND COMPLICATIONS  Possible complications associated with this procedure include:  Bleeding and risk of blood transfusion. Tell your health care provider if you do not want to receive any blood products.  Blood clots in the legs or lung.  Infection.  Injury to surrounding organs.  Problems or side effects related to anesthesia.  Conversion to an abdominal hysterectomy from one of the other techniques. WHAT TO EXPECT AFTER A HYSTERECTOMY  You will be given pain medicine.  You will need to have someone with you for the first 3 5 days after you go home.  You will need to follow up with your surgeon in 2 4 weeks after surgery to evaluate your progress.  You may have early menopause symptoms such as hot flashes, night sweats, and insomnia.  If you had a hysterectomy for a problem that was not cancer or not a condition that could lead to cancer, then you no longer need Pap tests. However, even if you no longer need a Pap test, a regular exam is a good idea to make sure no other problems are starting. Document Released: 10/28/2000 Document Revised: 02/22/2013 Document Reviewed: 01/09/2013 [facility] Patient Information 2014 [place]. Diagnostic Laparoscopy Laparoscopy is a  surgical procedure. It is used to diagnose and treat diseases inside the belly (abdomen). It is usually a brief, common, and relatively simple procedure. The  laparoscopeis a thin, lighted, pencil-sized instrument. It is like a telescope. It is inserted into your abdomen through a small cut (incision). Your caregiver can look at the organs inside your body through this instrument. He or she can see if there is anything abnormal. Laparoscopy can be done either in a hospital or outpatient clinic. You may be given a mild sedative to help you relax before the procedure. Once in the operating room, you will be given a drug to make you sleep (general anesthesia). Laparoscopy usually lasts less than 1 hour. After the procedure, you will be monitored in a recovery area until you are stable and doing well. Once you are home, it will take 2 to 3 days to fully recover. RISKS AND COMPLICATIONS  Laparoscopy has relatively few risks. Your caregiver will discuss the risks with you before the procedure. Some problems that can occur include:  Infection.  Bleeding.  Damage to other organs.  Anesthetic side effects. PROCEDURE Once you receive anesthesia, your surgeon inflates the abdomen with a harmless gas (carbon dioxide). This makes the organs easier to see. The laparoscope is inserted into the abdomen through a small incision. This allows your surgeon to see into the abdomen. Other small instruments are also inserted into the abdomen through other small openings. Many surgeons attach a video camera to the laparoscope to enlarge the view. During a diagnostic laparoscopy, the surgeon may be looking for inflammation, infection, or cancer. Your surgeon may take tissue samples(biopsies). The samples are sent to a specialist in looking at cells and tissue samples (pathologist). The pathologist examines them under a microscope. Biopsies can help to diagnose or confirm a disease. AFTER THE PROCEDURE   The gas is released from inside the abdomen.  The incisions are closed with stitches (sutures). Because these incisions are small (usually less than 1/2 inch), there is usually  minimal discomfort after the procedure. There may be some mild discomfort in the throat. This is from the tube placed in the throat while you were sleeping. You may have some mild abdominal discomfort. There may also be discomfort from the instrument placement incisions in the abdomen.  The recovery time is shortened as long as there are no complications.  You will rest in a recovery room until stable and doing well. As long as there are no complications, you may be allowed to go home. FINDING OUT THE RESULTS OF YOUR TEST Not all test results are available during your visit. If your test results are not back during the visit, make an appointment with your caregiver to find out the results. Do not assume everything is normal if you have not heard from your caregiver or the medical facility. It is important for you to follow up on all of your test results. HOME CARE INSTRUCTIONS   Take all medicines as directed.  Only take over-the-counter or prescription medicines for pain, discomfort, or fever as directed by your caregiver.  Resume daily activities as directed.  Showers are preferred over baths.  You may resume sexual activities in 1 week or as directed.  Do not drive while taking narcotics. SEEK MEDICAL CARE IF:   There is increasing abdominal pain.  There is new pain in the shoulders (shoulder strap areas).  You feel lightheaded or faint.  You have the chills.  You or your child has an  oral temperature above 102 F (38.9 C).  There is pus-like (purulent) drainage from any of the wounds.  You are unable to pass gas or have a bowel movement.  You feel sick to your stomach (nauseous) or throw up (vomit). MAKE SURE YOU:   Understand these instructions.  Will watch your condition.  Will get help right away if you are not doing well or get worse. Document Released: 08/10/2000 Document Revised: 08/29/2012 Document Reviewed: 05/04/2007 [facility] Patient Information 2014  [place], [place]. Fibroids Fibroids are lumps (tumors) that can occur any place in a woman's body. These lumps are not cancerous. Fibroids vary in size, weight, and where they grow. HOME CARE  Do not take aspirin.  Write down the number of pads or tampons you use during your period. Tell your doctor. This can help determine the best treatment for you. GET HELP RIGHT AWAY IF:  You have pain in your lower belly (abdomen) that is not helped with medicine.  You have cramps that are not helped with medicine.  You have more bleeding between or during your period.  You feel lightheaded or pass out (faint).  Your lower belly pain gets worse. MAKE SURE YOU:  Understand these instructions.  Will watch your condition.  Will get help right away if you are not doing well or get worse. Document Released: 06/06/2010 Document Revised: 07/27/2011 Document Reviewed: 06/06/2010 [facility] Patient Information 2014 [place], [place].

## 2013-09-04 NOTE — Progress Notes (Signed)
   Subjective:    Patient ID: Connie Bryant, female    DOB: June 05, 1965, 48 y.o.   MRN: [id number]  HPI  48 yo M AA G2P2 (25 and 18) here for MAU follow up. She has a known h/o fibroids and anemia. She was seen in the [place] 3/15 and was given Megace daily. She has not had a period since starting megace.  Review of Systems Mammogram UTD Pap smear due Works in home care    Objective:   Physical Exam  1st degree uterine prolapse NSSmid plane, NT      Assessment & Plan:  We have discussed treatment options and she opts for a LAVH/BSO. I will send [place] an email to schedule this surgery.

## 2013-09-21 ENCOUNTER — Telehealth: Payer: Self-pay | Admitting: Internal Medicine

## 2013-09-21 ENCOUNTER — Telehealth: Payer: Self-pay

## 2013-09-21 NOTE — Telephone Encounter (Signed)
Pt calling regarding results after GYN visit at [facility] clinic. Please f/u with pt. Pt is disappointed because says that she is always having to call us for results instead of a nurse or clinician following up with her.

## 2013-09-21 NOTE — Telephone Encounter (Signed)
Pt. Called requesting results from her last appointment on 09/04/13. Called pt. And informed her of results. No questions or concerns.

## 2013-09-25 ENCOUNTER — Telehealth: Payer: Self-pay | Admitting: Emergency Medicine

## 2013-09-25 NOTE — Telephone Encounter (Signed)
Pt states she was already mailed GYN results

## 2013-11-24 ENCOUNTER — Ambulatory Visit: Admit: 2013-11-24 | Payer: Self-pay | Admitting: Obstetrics & Gynecology

## 2013-11-24 SURGERY — HYSTERECTOMY, VAGINAL, LAPAROSCOPY-ASSISTED
Anesthesia: Choice | Site: Abdomen | Laterality: Bilateral

## 2014-02-07 ENCOUNTER — Telehealth: Payer: Self-pay | Admitting: General Practice

## 2014-02-07 NOTE — Telephone Encounter (Signed)
Pt returned our call at [phone] and left message to call her back.  She stated that she sees Dr. Hulan Fray for her fibroids.

## 2014-02-07 NOTE — Telephone Encounter (Signed)
Patient called and left message stating she went to the ER in March and was given a medication to slow down her cycle which helped for 5 months and she didn't have a cycle. Now the bleeding has started again off and on and doesn't know if she needs a different dose. Per chart review it appears patient was scheduled for hysterectomy but surgery was canceled. Called patient, no answer- left message that we are trying to return your phone call, please call us back at the clinics. If patient wants to be seen, I think she have [employer]- could be seen sooner at [facility] or [place]

## 2014-02-08 NOTE — Telephone Encounter (Signed)
Called patient and she states that she bleeds off and on and is currently not bleeding and isn't sure if she needs to be seen or not. Patient states she canceled her surgery due to finances but wanted to do it the first of the year possibly. Told patient she needs an appt to come in and be seen since the plan has currently changed and offered her a sooner appt at one of our other offices as our soonest is in November. Patient verbalized understanding and states she would rather wait and be seen here at the [facility] office. Told patient I will let our front office know and they will contact her with the appt. Patient verbalized understanding and states she likes Friday appts. Told patient I will pass that along. Patient verbalized understanding and had no other questions

## 2014-03-14 ENCOUNTER — Telehealth: Payer: Self-pay | Admitting: *Deleted

## 2014-03-14 NOTE — Telephone Encounter (Addendum)
Pt left message stating that she has next appt w/Dr. Hulan Fray on 11/30 and is having a problem. She has been bleeding for 2 months and the medication is no longer working. I called pt back and discussed her concern. She says that when she was started on the medication (megace) last March, her bleeding had stopped. Now it has restarted even though she takes the medication daily. Some days the bleeding is heavy and on other days it is light. I advised pt of the following: we have no appts w/Dr. Hulan Fray sooner than her scheduled appt on 11/30, however, she may be able to see Dr. Hulan Fray at the [facility] office. Also, I can ask Dr. Hulan Fray if she would like to temporarily increase the dosage of Megace.  Pt requested that I speak with Dr. Hulan Fray and call her back later today. If needed, she is agreeable to seeing Dr. Hulan Fray at the other office. I advised that I will call her back this afternoon. Pt stated that a detailed message can be left on her voice mail if she does not answer.  [place] w/Dr. Hulan Fray- she would like to see pt today or next week at [facility] office. She did not want to increase dose of Megace at this time. I called pt and offered her appt this afternoon. She is at work until [place]. Pt agreed to appt @ [facility] office on 11/4 @ [phone].

## 2014-03-19 ENCOUNTER — Encounter: Payer: Self-pay | Admitting: Obstetrics & Gynecology

## 2014-03-21 ENCOUNTER — Ambulatory Visit: Payer: BC Managed Care – PPO | Admitting: Obstetrics & Gynecology

## 2014-04-03 ENCOUNTER — Ambulatory Visit: Payer: BC Managed Care – PPO | Admitting: Internal Medicine

## 2014-04-16 ENCOUNTER — Other Ambulatory Visit (HOSPITAL_COMMUNITY)
Admission: RE | Admit: 2014-04-16 | Discharge: 2014-04-16 | Disposition: A | Discharge status: Home / Self Care | Payer: BC Managed Care – PPO | Source: Ambulatory Visit | Attending: Obstetrics & Gynecology | Admitting: Obstetrics & Gynecology

## 2014-04-16 ENCOUNTER — Encounter: Payer: Self-pay | Admitting: Obstetrics & Gynecology

## 2014-04-16 ENCOUNTER — Ambulatory Visit (INDEPENDENT_AMBULATORY_CARE_PROVIDER_SITE_OTHER): Payer: BC Managed Care – PPO | Admitting: Obstetrics & Gynecology

## 2014-04-16 VITALS — BP 116/59 | HR 68 | Temp 98.3°F | Wt 213.9 lb

## 2014-04-16 DIAGNOSIS — N920 Excessive and frequent menstruation with regular cycle: Secondary | ICD-10-CM

## 2014-04-16 LAB — POCT PREGNANCY, URINE
PREG TEST UR: NEGATIVE
Preg Test, Ur: NEGATIVE

## 2014-04-16 MED ORDER — POLYSACCHARIDE IRON COMPLEX 150 MG PO CAPS: 150.0000 mg | ORAL_CAPSULE | Freq: Every day | ORAL | Status: DC

## 2014-04-16 MED ORDER — MEGESTROL ACETATE 40 MG PO TABS: 40.0000 mg | ORAL_TABLET | Freq: Two times a day (BID) | ORAL | Status: DC

## 2014-04-16 NOTE — Progress Notes (Signed)
   Subjective:    Patient ID: Connie Bryant, female    DOB: 12/26/1965, 48 y.o.   MRN: [id number]  HPI  This AA P2 with menorrhagia and fibroids is here to get a refill of her megace and iron pills. She also wants to reschedule her LAVH/BS which she cancelled this past summer. She wanted to make sure that her son graduated. He has and now she is ready to have her surgery.  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    Objective:   Physical Exam   UPT negative, consent signed, time out done Cervix prepped with betadine and grasped with a single tooth tenaculum Uterus sounded to 8 cm Pipelle used for 2 passes with a moderate amount of tissue obtained. She tolerated the procedure well.  A bimanual exam reveals her uterus to be NSSA, mobile, NT, no adnexal masses palpable      Assessment & Plan:  Menorrhagia- await EMBX, send Connie Bryant an email to reschedule her surgery I have refilled her iron and megace.

## 2014-04-17 ENCOUNTER — Other Ambulatory Visit: Payer: BC Managed Care – PPO

## 2014-04-17 DIAGNOSIS — D649 Anemia, unspecified: Secondary | ICD-10-CM

## 2014-04-18 LAB — CBC WITH DIFFERENTIAL/PLATELET
Basophils Absolute: 0 10*3/uL (ref 0.0–0.1)
Basophils Relative: 0 % (ref 0–1)
EOS ABS: 0.1 10*3/uL (ref 0.0–0.7)
Eosinophils Relative: 3 % (ref 0–5)
HCT: 35.3 % — ABNORMAL LOW (ref 36.0–46.0)
HEMOGLOBIN: 11.6 g/dL — AB (ref 12.0–15.0)
LYMPHS ABS: 1.3 10*3/uL (ref 0.7–4.0)
Lymphocytes Relative: 27 % (ref 12–46)
MCH: 26.5 pg (ref 26.0–34.0)
MCHC: 32.9 g/dL (ref 30.0–36.0)
MCV: 80.8 fL (ref 78.0–100.0)
MONOS PCT: 12 % (ref 3–12)
MPV: 10.1 fL (ref 9.4–12.4)
Monocytes Absolute: 0.6 10*3/uL (ref 0.1–1.0)
NEUTROS PCT: 58 % (ref 43–77)
Neutro Abs: 2.8 10*3/uL (ref 1.7–7.7)
Platelets: 270 10*3/uL (ref 150–400)
RBC: 4.37 MIL/uL (ref 3.87–5.11)
RDW: 16.4 % — ABNORMAL HIGH (ref 11.5–15.5)
WBC: 4.8 10*3/uL (ref 4.0–10.5)

## 2014-04-18 LAB — TSH: TSH: 0.796 u[IU]/mL (ref 0.350–4.500)

## 2014-05-04 ENCOUNTER — Encounter: Payer: Self-pay | Admitting: *Deleted

## 2014-06-22 ENCOUNTER — Other Ambulatory Visit: Payer: Self-pay

## 2014-06-22 ENCOUNTER — Encounter (HOSPITAL_COMMUNITY): Payer: Self-pay

## 2014-06-22 ENCOUNTER — Encounter (HOSPITAL_COMMUNITY)
Admission: RE | Admit: 2014-06-22 | Discharge: 2014-06-22 | Disposition: A | Discharge status: Home / Self Care | Payer: BLUE CROSS/BLUE SHIELD | Source: Ambulatory Visit | Attending: Obstetrics & Gynecology | Admitting: Obstetrics & Gynecology

## 2014-06-22 DIAGNOSIS — Z01818 Encounter for other preprocedural examination: Secondary | ICD-10-CM | POA: Diagnosis not present

## 2014-06-22 DIAGNOSIS — N920 Excessive and frequent menstruation with regular cycle: Secondary | ICD-10-CM | POA: Diagnosis not present

## 2014-06-22 DIAGNOSIS — D259 Leiomyoma of uterus, unspecified: Secondary | ICD-10-CM | POA: Insufficient documentation

## 2014-06-22 HISTORY — DX: Pneumonia, unspecified organism: J18.9

## 2014-06-22 LAB — BASIC METABOLIC PANEL
Anion gap: 3 — ABNORMAL LOW (ref 5–15)
BUN: 12 mg/dL (ref 6–23)
CALCIUM: 9.3 mg/dL (ref 8.4–10.5)
CHLORIDE: 108 mmol/L (ref 96–112)
CO2: 24 mmol/L (ref 19–32)
Creatinine, Ser: 0.97 mg/dL (ref 0.50–1.10)
GFR calc Af Amer: 78 mL/min — ABNORMAL LOW (ref >90)
GFR calc non Af Amer: 67 mL/min — ABNORMAL LOW (ref >90)
Glucose, Bld: 93 mg/dL (ref 70–99)
Potassium: 4.3 mmol/L (ref 3.5–5.1)
SODIUM: 135 mmol/L (ref 135–145)

## 2014-06-22 LAB — CBC
HEMATOCRIT: 35.9 % — AB (ref 36.0–46.0)
Hemoglobin: 11.8 g/dL — ABNORMAL LOW (ref 12.0–15.0)
MCH: 27.6 pg (ref 26.0–34.0)
MCHC: 32.9 g/dL (ref 30.0–36.0)
MCV: 83.9 fL (ref 78.0–100.0)
PLATELETS: 234 10*3/uL (ref 150–400)
RBC: 4.28 MIL/uL (ref 3.87–5.11)
RDW: 14.9 % (ref 11.5–15.5)
WBC: 4.3 10*3/uL (ref 4.0–10.5)

## 2014-06-22 NOTE — Progress Notes (Signed)
EKG OK FOR SURGERY PER DR. Rudean Curt

## 2014-06-22 NOTE — Patient Instructions (Signed)
   Your procedure is scheduled on: FEB 16 AT 730AM  Enter through the Main Entrance of [facility] at:6AM  [place] up the phone at the desk and dial [phone] and inform us of your arrival.  Please call this number if you have any problems the morning of surgery: [phone]  Remember: Do not eat food after midnight:FEB 15 Do not drink clear liquids after:FEB 15 Take these medicines the morning of surgery with a SIP OF WATER: TAKE ATENOLOL AM OF SURGERY.. DO NOT TAKE HCTZ  Do not wear jewelry, make-up, or FINGER nail polish No metal in your hair or on your body. Do not wear lotions, powders, perfumes.  You may wear deodorant.  Do not bring valuables to the hospital. Contacts, dentures or bridgework may not be worn into surgery.  Leave suitcase in the car. After Surgery it may be brought to your room. For patients being admitted to the hospital, checkout time is 11:00am the day of discharge.    Patients discharged on the day of surgery will not be allowed to drive home.

## 2014-06-28 ENCOUNTER — Telehealth: Payer: Self-pay

## 2014-06-28 NOTE — Telephone Encounter (Signed)
FMLA/Disability paperwork completed. Patient wished to bring it to her employer herself. Called patient and informed her paperwork is ready for pick up. Patient verbalized understanding and gratitude and stated she will be by prior to surgery to pick it up. No questions or concerns.

## 2014-07-03 ENCOUNTER — Ambulatory Visit (HOSPITAL_COMMUNITY): Payer: BLUE CROSS/BLUE SHIELD | Admitting: Anesthesiology

## 2014-07-03 ENCOUNTER — Encounter (HOSPITAL_COMMUNITY)
Admission: RE | Disposition: A | Discharge status: Home / Self Care | Payer: Self-pay | Source: Ambulatory Visit | Attending: Obstetrics & Gynecology

## 2014-07-03 ENCOUNTER — Telehealth: Payer: Self-pay | Admitting: General Practice

## 2014-07-03 ENCOUNTER — Ambulatory Visit (HOSPITAL_COMMUNITY)
Admission: RE | Admit: 2014-07-03 | Discharge: 2014-07-03 | Disposition: A | Discharge status: Home / Self Care | Payer: BLUE CROSS/BLUE SHIELD | Source: Ambulatory Visit | Attending: Obstetrics & Gynecology | Admitting: Obstetrics & Gynecology

## 2014-07-03 ENCOUNTER — Telehealth: Payer: Self-pay

## 2014-07-03 ENCOUNTER — Encounter (HOSPITAL_COMMUNITY): Payer: Self-pay | Admitting: *Deleted

## 2014-07-03 DIAGNOSIS — N920 Excessive and frequent menstruation with regular cycle: Secondary | ICD-10-CM | POA: Diagnosis not present

## 2014-07-03 DIAGNOSIS — I10 Essential (primary) hypertension: Secondary | ICD-10-CM | POA: Diagnosis not present

## 2014-07-03 DIAGNOSIS — Z87891 Personal history of nicotine dependence: Secondary | ICD-10-CM | POA: Insufficient documentation

## 2014-07-03 DIAGNOSIS — K219 Gastro-esophageal reflux disease without esophagitis: Secondary | ICD-10-CM | POA: Insufficient documentation

## 2014-07-03 DIAGNOSIS — Z5309 Procedure and treatment not carried out because of other contraindication: Secondary | ICD-10-CM | POA: Insufficient documentation

## 2014-07-03 LAB — PREGNANCY, URINE: Preg Test, Ur: NEGATIVE

## 2014-07-03 SURGERY — HYSTERECTOMY, VAGINAL, LAPAROSCOPY-ASSISTED
Anesthesia: General | Site: Vagina | Laterality: Bilateral

## 2014-07-03 MED ORDER — FENTANYL CITRATE 0.05 MG/ML IJ SOLN
INTRAMUSCULAR | Status: AC
  Filled 2014-07-03: qty 5

## 2014-07-03 MED ORDER — KETOROLAC TROMETHAMINE 30 MG/ML IJ SOLN
INTRAMUSCULAR | Status: AC
  Filled 2014-07-03: qty 1

## 2014-07-03 MED ORDER — GLYCOPYRROLATE 0.2 MG/ML IJ SOLN
INTRAMUSCULAR | Status: AC
  Filled 2014-07-03: qty 3

## 2014-07-03 MED ORDER — NEOSTIGMINE METHYLSULFATE 10 MG/10ML IV SOLN
INTRAVENOUS | Status: AC
  Filled 2014-07-03: qty 1

## 2014-07-03 MED ORDER — ONDANSETRON HCL 4 MG/2ML IJ SOLN
INTRAMUSCULAR | Status: AC
  Filled 2014-07-03: qty 2

## 2014-07-03 MED ORDER — SCOPOLAMINE 1 MG/3DAYS TD PT72: 1.0000 | MEDICATED_PATCH | Freq: Once | TRANSDERMAL | Status: DC

## 2014-07-03 MED ORDER — LACTATED RINGERS IV SOLN
INTRAVENOUS | Status: DC
  Administered 2014-07-03: 07:00:00 via INTRAVENOUS

## 2014-07-03 MED ORDER — PROPOFOL 10 MG/ML IV BOLUS
INTRAVENOUS | Status: AC
  Filled 2014-07-03: qty 20

## 2014-07-03 MED ORDER — SCOPOLAMINE 1 MG/3DAYS TD PT72
MEDICATED_PATCH | TRANSDERMAL | Status: AC
  Filled 2014-07-03: qty 1

## 2014-07-03 MED ORDER — DEXAMETHASONE SODIUM PHOSPHATE 4 MG/ML IJ SOLN
INTRAMUSCULAR | Status: AC
  Filled 2014-07-03: qty 1

## 2014-07-03 MED ORDER — ROCURONIUM BROMIDE 100 MG/10ML IV SOLN
INTRAVENOUS | Status: AC
  Filled 2014-07-03: qty 1

## 2014-07-03 MED ORDER — CEFAZOLIN SODIUM-DEXTROSE 2-3 GM-% IV SOLR
INTRAVENOUS | Status: AC
  Filled 2014-07-03: qty 50

## 2014-07-03 MED ORDER — CEFAZOLIN SODIUM-DEXTROSE 2-3 GM-% IV SOLR: 2.0000 g | INTRAVENOUS | Status: DC

## 2014-07-03 MED ORDER — LIDOCAINE HCL (CARDIAC) 20 MG/ML IV SOLN
INTRAVENOUS | Status: AC
  Filled 2014-07-03: qty 5

## 2014-07-03 MED ORDER — MIDAZOLAM HCL 2 MG/2ML IJ SOLN
INTRAMUSCULAR | Status: AC
  Filled 2014-07-03: qty 2

## 2014-07-03 SURGICAL SUPPLY — 36 items
APPLICATOR COTTON TIP 6IN STRL (MISCELLANEOUS) IMPLANT
CANISTER SUCT 3000ML (MISCELLANEOUS) IMPLANT
CLOTH BEACON ORANGE TIMEOUT ST (SAFETY) IMPLANT
CONT PATH 16OZ SNAP LID 3702 (MISCELLANEOUS) IMPLANT
DECANTER SPIKE VIAL GLASS SM (MISCELLANEOUS) IMPLANT
DRSG COVADERM PLUS 2X2 (GAUZE/BANDAGES/DRESSINGS) IMPLANT
DRSG OPSITE POSTOP 3X4 (GAUZE/BANDAGES/DRESSINGS) IMPLANT
DURAPREP 26ML APPLICATOR (WOUND CARE) IMPLANT
ELECT REM PT RETURN 9FT ADLT (ELECTROSURGICAL)
ELECTRODE REM PT RTRN 9FT ADLT (ELECTROSURGICAL) IMPLANT
GLOVE BIO SURGEON STRL SZ 6.5 (GLOVE) IMPLANT
GLOVE BIOGEL PI IND STRL 7.0 (GLOVE) IMPLANT
GLOVE BIOGEL PI INDICATOR 7.0 (GLOVE)
NDL SAFETY ECLIPSE 18X1.5 (NEEDLE) IMPLANT
NEEDLE HYPO 18GX1.5 SHARP (NEEDLE)
NEEDLE INSUFFLATION 120MM (ENDOMECHANICALS) IMPLANT
NEEDLE SPNL 18GX3.5 QUINCKE PK (NEEDLE) IMPLANT
NS IRRIG 1000ML POUR BTL (IV SOLUTION) IMPLANT
PACK LAVH (CUSTOM PROCEDURE TRAY) IMPLANT
PACK ROBOTIC GOWN (GOWN DISPOSABLE) IMPLANT
PAD POSITIONER PINK NONSTERILE (MISCELLANEOUS) IMPLANT
PROTECTOR NERVE ULNAR (MISCELLANEOUS) IMPLANT
SET IRRIG TUBING LAPAROSCOPIC (IRRIGATION / IRRIGATOR) IMPLANT
SHEARS HARMONIC ACE PLUS 36CM (ENDOMECHANICALS) IMPLANT
SLEEVE XCEL OPT CAN 5 100 (ENDOMECHANICALS) IMPLANT
SUT VIC AB 0 CT1 36 (SUTURE) IMPLANT
SUT VIC AB 2-0 CT1 18 (SUTURE) IMPLANT
SUT VICRYL 0 UR6 27IN ABS (SUTURE) IMPLANT
SUT VICRYL 4-0 PS2 18IN ABS (SUTURE) IMPLANT
SYR 30ML LL (SYRINGE) IMPLANT
SYR BULB IRRIGATION 50ML (SYRINGE) IMPLANT
TOWEL OR 17X24 6PK STRL BLUE (TOWEL DISPOSABLE) IMPLANT
TRAY FOLEY CATH 14FR (SET/KITS/TRAYS/PACK) IMPLANT
TROCAR OPTI TIP 5M 100M (ENDOMECHANICALS) IMPLANT
TROCAR XCEL DIL TIP R 11M (ENDOMECHANICALS) IMPLANT
WARMER LAPAROSCOPE (MISCELLANEOUS) IMPLANT

## 2014-07-03 NOTE — Progress Notes (Signed)
Charleston Ropes called from [place] and states that patient has an appt scheduled for tomorrow, 07/04/14, for medical clearance.

## 2014-07-03 NOTE — Progress Notes (Signed)
She arrived for her surgery this morning but her recent EKG shows some abnormalities. She also gives a strong FH of cardiac disease. I have spoken with her and her husband after Dr. Jillyn Hidden expressed his concerns to me. They understand that her surgery will be rescheduled after a cardiologist gives her clearance for surgery.  I will send an email to the nurses to get an appt with a cardiologist.

## 2014-07-03 NOTE — Telephone Encounter (Signed)
-----   Message from Emily Filbert, MD sent at 07/03/2014  7:06 AM EST ----- Her surgery has been delayed until we can get a cardiologist to give her surgical clearance. Can you please arrange this? Thanks

## 2014-07-03 NOTE — Anesthesia Preprocedure Evaluation (Signed)
Anesthesia Evaluation  Patient identified by MRN, date of birth, ID band Patient awake    Reviewed: Allergy & Precautions, H&P , Patient's Chart, lab work & pertinent test results, reviewed documented beta blocker date and time   Airway Mallampati: I  TM Distance: >3 FB Neck ROM: full    Dental  (+) Teeth Intact Protruding Maxillary teeth:   Pulmonary former smoker,    Pulmonary exam normal       Cardiovascular hypertension, Pt. on home beta blockers     Neuro/Psych    GI/Hepatic Neg liver ROS, GERD-  Medicated and Controlled,  Endo/Other  negative endocrine ROS  Renal/GU negative Renal ROS     Musculoskeletal   Abdominal (+) + obese,   Peds  Hematology negative hematology ROS (+)   Anesthesia Other Findings   Reproductive/Obstetrics negative OB ROS                             Anesthesia Physical Anesthesia Plan  ASA: III  Anesthesia Plan: General   Post-op Pain Management:    Induction: Intravenous  Airway Management Planned: Oral ETT  Additional Equipment:   Intra-op Plan:   Post-operative Plan: Extubation in OR  Informed Consent: I have reviewed the patients History and Physical, chart, labs and discussed the procedure including the risks, benefits and alternatives for the proposed anesthesia with the patient or authorized representative who has indicated his/her understanding and acceptance.   Dental Advisory Given  Plan Discussed with: CRNA and Surgeon  Anesthesia Plan Comments: (In light of history which now includes multiple family members with ASCVD I will defer to Dr. Glennon Mac. After discussion with Dr. Hulan Fray we have decided to cancel suelective surgery in favor of further cardiac work up.)        Anesthesia Quick Evaluation

## 2014-07-03 NOTE — Telephone Encounter (Signed)
Patient called and left message stating she is leaving a message for Dr Alease Medina nurse. She was able to get an appt with [facility] tomorrow at 5pm and we should be able to see all that since we all have the same system. Called patient back and told her to let us know the outcome of her appt, that way if she is approved for surgery- we can let someone know so her surgery can be rescheduled. Patient verbalized understanding and had no other questions

## 2014-07-03 NOTE — H&P (Signed)
Her surgery will be rescheduled after cardiology clearance.

## 2014-07-03 NOTE — Telephone Encounter (Signed)
Called patient. She has PCP at [facility]. Advised she make appointment with them for surgical clearance-- informed her if her PCP is uncomfortable with clearing her for surgery he will then refer her to cardiologist for clearance. Patient verbalized understanding and stated she was driving there now to make an appointment-- advised patient call clinic to inform us of when she has an appointment scheduled. Patient verbalized understanding and stated she would. Called [facility] myself and left message on appointment line stating patient is in need of surgical clearance appointment-- please schedule if not already done so.

## 2014-07-04 ENCOUNTER — Ambulatory Visit: Payer: BLUE CROSS/BLUE SHIELD | Attending: Internal Medicine | Admitting: Internal Medicine

## 2014-07-04 ENCOUNTER — Encounter: Payer: Self-pay | Admitting: Internal Medicine

## 2014-07-04 VITALS — BP 150/92 | HR 71 | Temp 98.4°F | Resp 16 | Ht 67.5 in | Wt 223.0 lb

## 2014-07-04 DIAGNOSIS — I10 Essential (primary) hypertension: Secondary | ICD-10-CM | POA: Diagnosis present

## 2014-07-04 DIAGNOSIS — Z87891 Personal history of nicotine dependence: Secondary | ICD-10-CM | POA: Diagnosis not present

## 2014-07-04 DIAGNOSIS — H409 Unspecified glaucoma: Secondary | ICD-10-CM | POA: Insufficient documentation

## 2014-07-04 DIAGNOSIS — Z0000 Encounter for general adult medical examination without abnormal findings: Secondary | ICD-10-CM

## 2014-07-04 DIAGNOSIS — Z8249 Family history of ischemic heart disease and other diseases of the circulatory system: Secondary | ICD-10-CM | POA: Insufficient documentation

## 2014-07-04 DIAGNOSIS — Z0189 Encounter for other specified special examinations: Secondary | ICD-10-CM

## 2014-07-04 DIAGNOSIS — D649 Anemia, unspecified: Secondary | ICD-10-CM | POA: Insufficient documentation

## 2014-07-04 NOTE — Progress Notes (Signed)
Patient ID: Connie Bryant, female   DOB: [birth year], 49 y.o.   MRN: [id number]  CC: surgical clearance   HPI: Connie Bryant is a 49 y.o. female here today for a follow up visit.  Patient has past medical history of uterine fibroids, HTN, anemia. She states that she was due to have a partial hysterectomy yesterday but was told that she needed surgical clearance before she can have the procedure. 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  She has a family history of MI- sister at 69 and mom at unknown age. Brother passed away from heart condition at age 25's. Family history of T2DM.  She recently quit smoking 2 weeks ago.  Had a wellness check in January with her insurance company and was told that her labs were normal. Cholesterol was normal. Takes HCTZ 25 in morning and takes atenolol at bedtime due to sleepiness.   Patient has No headache, No chest pain, No abdominal pain - No Nausea, No new weakness tingling or numbness, No Cough - SOB.  Allergies  Allergen Reactions  . Oxycodone-Acetaminophen Hives  . Tylox [Oxycodone-Acetaminophen] Hives   Past Medical History  Diagnosis Date  . Hypertension   . Glaucoma   . Anemia   . Obesity   . Pneumonia     2008   Current Outpatient Prescriptions on File Prior to Visit  Medication Sig Dispense Refill  . atenolol (TENORMIN) 100 MG tablet Take 1 tablet (100 mg total) by mouth daily. 30 tablet 6  . butalbital-acetaminophen-caffeine (FIORICET) 50-325-40 MG per tablet Take 1 tablet by mouth 3 (three) times daily with meals as needed for headache. 20 tablet 0  . famotidine (PEPCID) 20 MG tablet Take 1 tablet (20 mg total) by mouth daily. (Patient taking differently: Take 20 mg by mouth daily as needed. ) 60 tablet 6  . hydrochlorothiazide (HYDRODIURIL) 25 MG tablet Take 1 tablet (25 mg total) by mouth daily. 30 tablet 6  . hydrOXYzine (ATARAX/VISTARIL) 25 MG tablet Take 1 tablet (25 mg total) by mouth every 8 (eight)  hours as needed for itching. 60 tablet 0  . ibuprofen (ADVIL,MOTRIN) 200 MG tablet Take 400 mg by mouth every 6 (six) hours as needed for headache.    . ibuprofen (ADVIL,MOTRIN) 600 MG tablet Take 1 tablet (600 mg total) by mouth every 8 (eight) hours as needed. 30 tablet 0  . iron polysaccharides (NIFEREX) 150 MG capsule Take 1 capsule (150 mg total) by mouth daily. 30 capsule 5  . megestrol (MEGACE) 40 MG tablet Take 1 tablet (40 mg total) by mouth 2 (two) times daily. 60 tablet 5  . travoprost, benzalkonium, (TRAVATAN) 0.004 % ophthalmic solution Place 1 drop into both eyes at bedtime.    . hydrocortisone ointment 0.5 % Apply 1 application topically 2 (two) times daily. (Patient not taking: Reported on 07/04/2014) 30 g 2  . traMADol (ULTRAM) 50 MG tablet Take 1 tablet (50 mg total) by mouth every 8 (eight) hours as needed. (Patient not taking: Reported on 06/14/2014) 30 tablet 0   No current facility-administered medications on file prior to visit.   Family History  Problem Relation Age of Onset  . Diabetes Mother   . Kidney disease Mother   . Diabetes Sister   . Diabetes Brother   . Heart disease Brother    History   Social History  . Marital Status: Married    Spouse Name: N/A  . Number of Children: N/A  .  Years of Education: N/A   Occupational History  . Not on file.   Social History Main Topics  . Smoking status: Former Smoker    Quit date: 06/18/2014  . Smokeless tobacco: Not on file  . Alcohol Use: Not on file     Comment: toddy  now  and  then  . Drug Use: No  . Sexual Activity: Yes    Birth Control/ Protection: Surgical   Other Topics Concern  . Not on file   Social History Narrative   ** Merged History Encounter **        Review of Systems: See HPI  Objective:   Filed Vitals:   07/04/14 1721  BP: 150/92  Pulse: 71  Temp: 98.4 F (36.9 C)  Resp: 16    Physical Exam  Constitutional: She is oriented to person, place, and time.  Cardiovascular:  Normal rate, regular rhythm and normal heart sounds.   Pulmonary/Chest: Effort normal and breath sounds normal.  Abdominal: Soft. Bowel sounds are normal. There is no tenderness.  Musculoskeletal: Normal range of motion.  Neurological: She is alert and oriented to person, place, and time.  Skin: Skin is warm and dry.     Lab Results  Component Value Date   WBC 4.3 06/22/2014   HGB 11.8* 06/22/2014   HCT 35.9* 06/22/2014   MCV 83.9 06/22/2014   PLT 234 06/22/2014   Lab Results  Component Value Date   CREATININE 0.97 06/22/2014   BUN 12 06/22/2014   NA 135 06/22/2014   K 4.3 06/22/2014   CL 108 06/22/2014   CO2 24 06/22/2014    Lab Results  Component Value Date   HGBA1C 5.2 04/25/2013   Lipid Panel     Component Value Date/Time   CHOL 154 04/25/2013 1439   TRIG 86 04/25/2013 1439   HDL 45 04/25/2013 1439   CHOLHDL 3.4 04/25/2013 1439   VLDL 17 04/25/2013 1439   LDLCALC 92 04/25/2013 1439       Assessment and plan:   Sadey was seen today for establish care.  Diagnoses and all orders for this visit:  Wellness examination Will send for a one time referral to Dr. Verl Blalock for surgical clearance. I believe the patient is fine but was told that she needs to be evaluated by cardiology due to family history and EKG.       Chari Manning, NP-C [facility] [phone] 07/04/2014, 5:51 PM

## 2014-07-04 NOTE — Progress Notes (Signed)
Pt is here following up on her HTN and anemia.  Pt is here today to get clearance to have surgery.

## 2014-07-11 ENCOUNTER — Ambulatory Visit: Payer: BLUE CROSS/BLUE SHIELD | Admitting: Cardiology

## 2014-07-17 ENCOUNTER — Telehealth: Payer: Self-pay

## 2014-07-17 NOTE — Telephone Encounter (Signed)
Pt called and stated that her surgery was cancelled on 16th and on the 17th she started spotting and she is continuing to take the medication that was given to her.  What do I do now?

## 2014-07-18 ENCOUNTER — Encounter: Payer: Self-pay | Admitting: Cardiology

## 2014-07-18 ENCOUNTER — Ambulatory Visit: Payer: BLUE CROSS/BLUE SHIELD | Attending: Cardiology | Admitting: Cardiology

## 2014-07-18 ENCOUNTER — Telehealth: Payer: Self-pay

## 2014-07-18 VITALS — BP 135/87 | HR 59 | Temp 98.6°F | Resp 18 | Ht 67.0 in | Wt 226.0 lb

## 2014-07-18 DIAGNOSIS — Z01810 Encounter for preprocedural cardiovascular examination: Secondary | ICD-10-CM | POA: Diagnosis not present

## 2014-07-18 DIAGNOSIS — R9431 Abnormal electrocardiogram [ECG] [EKG]: Secondary | ICD-10-CM | POA: Insufficient documentation

## 2014-07-18 MED ORDER — LISINOPRIL-HYDROCHLOROTHIAZIDE 20-25 MG PO TABS: 1.0000 | ORAL_TABLET | Freq: Every day | ORAL | Status: DC

## 2014-07-18 NOTE — Patient Instructions (Signed)
It was great meeting you today. Dr. Verl Blalock would like you to get an echocardiogram.  It is scheduled on 07/20/14 at 2:00pm at [facility].  Address:  [address] [place], [place].  Go to [employer] and arrive 15 minutes early. Please STOP taking hydrochlorothiazide 25 mg and START taking lisinopril-hydrochlorothiazide 20-25 mg every morning.  This was sent to [facility] on [address]. Please return to clinic in one week for labwork. Begin incorporating exercise (walking) into daily routine-30 minutes/day.  DASH Eating Plan DASH stands for "Dietary Approaches to Stop Hypertension." The DASH eating plan is a healthy eating plan that has been shown to reduce high blood pressure (hypertension). Additional health benefits may include reducing the risk of type 2 diabetes mellitus, heart disease, and stroke. The DASH eating plan may also help with weight loss. WHAT DO I NEED TO KNOW ABOUT THE DASH EATING PLAN? For the DASH eating plan, you will follow these general guidelines:  Choose foods with a percent daily value for sodium of less than 5% (as listed on the food label).  Use salt-free seasonings or herbs instead of table salt or sea salt.  Check with your health care provider or pharmacist before using salt substitutes.  Eat lower-sodium products, often labeled as "lower sodium" or "no salt added."  Eat fresh foods.  Eat more vegetables, fruits, and low-fat dairy products.  Choose whole grains. Look for the word "whole" as the first word in the ingredient list.  Choose fish and skinless chicken or [place] more often than red meat. Limit fish, poultry, and meat to 6 oz (170 g) each day.  Limit sweets, desserts, sugars, and sugary drinks.  Choose heart-healthy fats.  Limit cheese to 1 oz (28 g) per day.  Eat more home-cooked food and less restaurant, buffet, and fast food.  Limit fried foods.  Cook foods using methods other than frying.  Limit  canned vegetables. If you do use them, rinse them well to decrease the sodium.  When eating at a restaurant, ask that your food be prepared with less salt, or no salt if possible. WHAT FOODS CAN I EAT? Seek help from a dietitian for individual calorie needs. Grains Whole grain or whole wheat bread. Brown rice. Whole grain or whole wheat pasta. Quinoa, bulgur, and whole grain cereals. Low-sodium cereals. Corn or whole wheat flour tortillas. Whole grain cornbread. Whole grain crackers. Low-sodium crackers. Vegetables Fresh or frozen vegetables (raw, steamed, roasted, or grilled). Low-sodium or reduced-sodium tomato and vegetable juices. Low-sodium or reduced-sodium tomato sauce and paste. Low-sodium or reduced-sodium canned vegetables.  Fruits All fresh, canned (in natural juice), or frozen fruits. Meat and Other Protein Products Ground beef (85% or leaner), grass-fed beef, or beef trimmed of fat. Skinless chicken or [place]. Ground chicken or [place]. Pork trimmed of fat. All fish and seafood. Eggs. Dried beans, peas, or lentils. Unsalted nuts and seeds. Unsalted canned beans. Dairy Low-fat dairy products, such as skim or 1% milk, 2% or reduced-fat cheeses, low-fat ricotta or cottage cheese, or plain low-fat yogurt. Low-sodium or reduced-sodium cheeses. Fats and Oils Tub margarines without trans fats. Light or reduced-fat mayonnaise and salad dressings (reduced sodium). Avocado. Safflower, olive, or canola oils. Natural peanut or almond butter. Other Unsalted popcorn and pretzels. The items listed above may not be a complete list of recommended foods or beverages. Contact your dietitian for more options. WHAT FOODS ARE NOT RECOMMENDED? Grains White bread. White pasta. White rice. Refined cornbread. Bagels and  croissants. Crackers that contain trans fat. Vegetables Creamed or fried vegetables. Vegetables in a cheese sauce. Regular canned vegetables. Regular canned tomato sauce and paste. Regular  tomato and vegetable juices. Fruits Dried fruits. Canned fruit in light or heavy syrup. Fruit juice. Meat and Other Protein Products Fatty cuts of meat. Ribs, chicken wings, bacon, sausage, bologna, salami, chitterlings, fatback, hot dogs, bratwurst, and packaged luncheon meats. Salted nuts and seeds. Canned beans with salt. Dairy Whole or 2% milk, cream, half-and-half, and cream cheese. Whole-fat or sweetened yogurt. Full-fat cheeses or blue cheese. Nondairy creamers and whipped toppings. Processed cheese, cheese spreads, or cheese curds. Condiments Onion and garlic salt, seasoned salt, table salt, and sea salt. Canned and packaged gravies. Worcestershire sauce. Tartar sauce. Barbecue sauce. Teriyaki sauce. Soy sauce, including reduced sodium. Steak sauce. Fish sauce. Oyster sauce. Cocktail sauce. Horseradish. Ketchup and mustard. Meat flavorings and tenderizers. Bouillon cubes. Hot sauce. Tabasco sauce. Marinades. Taco seasonings. Relishes. Fats and Oils Butter, stick margarine, lard, shortening, ghee, and bacon fat. Coconut, palm kernel, or palm oils. Regular salad dressings. Other Pickles and olives. Salted popcorn and pretzels. The items listed above may not be a complete list of foods and beverages to avoid. Contact your dietitian for more information. WHERE CAN I FIND MORE INFORMATION? National Heart, Lung, and Blood Institute: travelstabloid.com Document Released: 04/23/2011 Document Revised: 09/18/2013 Document Reviewed: 03/08/2013 [facility] Patient Information 2015 [place], [place]. This information is not intended to replace advice given to you by your health care provider. Make sure you discuss any questions you have with your health care provider.  Thanks so much.

## 2014-07-18 NOTE — Assessment & Plan Note (Signed)
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 She does have coronary artery disease risk factors including obesity, sedentary lifestyle, poorly controlled hypertension, and family history.  We'll obtain 2-D echocardiogram to assess LV function and degree of LVH and rule out any Karmelo Bass motion abnormality. Assuming she has a normal ejection fraction, and LVH, we will clear her for surgery at low operative risk from a cardiovascular standpoint. I have spent a considerable amount of time talking about risk factors and her need to modify hers. We particularly stressed her weight and the risk of developing type 2 diabetes and its complications. I've also changed her to lisinopril 20/hydrochlorothiazide 25 mg every morning and continue her beta blocker. We'll check metabolic profile in a week.

## 2014-07-18 NOTE — Telephone Encounter (Addendum)
Called Connie Bryant and she reports she went back to [facility] today and saw the cardiologist and they are going to do an echo on 07/26/14. She also c/o spotting dark charcoal like blood since 06/24/14. States she is wearing minipads and only has to change her minipad twice a day. States she is taking megace 40 mg daily and that she is calling because  Usually that stops her bleeding.  I informed I would need to send a message to Dr. Hulan Fray and see if she wants to adjust her medicine. I informed her we would call her back once we hear back from Dr. Hulan Fray.   3/4  1130  I spoke with Dr. Hulan Fray regarding pt's concern. She stated that she does not wish to make changes to pt's Megace at this time.  I called pt and informed her of Dr. Alease Medina plan. She should continue the current dose of Megace and come to [facility] for evaluation if she has heavy bleeding. This is described as saturating one pad per hour for 3 consecutive hours.  She also stated that she has echocardiogram scheduled on 3/10 for surgical clearance. I asked pt to remind them that Dr. Hulan Fray will need the results of her test in order to schedule surgery. Pt voiced understanding of all information and instructions given.

## 2014-07-18 NOTE — Progress Notes (Signed)
Patient needing cardiac clearance for partial hysterectomy for fibroids. Patient asymptomatic-denies chest pain, shortness of breath, dizziness, headches, lower extremity swelling. Patient compliant with medications. Family history of cardiac disease-Mother, sister, and brother have had myocardial infarctions. EKG done on 06/22/14.

## 2014-07-18 NOTE — Assessment & Plan Note (Signed)
Encouraged her to lose weight and begin a walking program.

## 2014-07-18 NOTE — Progress Notes (Signed)
HPI Connie Bryant is a 49 year old black female referred by Michael Boston for preoperative clearance for a partial hysterectomy for fibroids. She has an abnormal EKG with ST segment changes in the inferior and lateral leads.  She has a premature history of coronary disease and a brother and sister. They both had diabetes. Her brother also smoked. She smoked for a few years but quit on her 49th birthday. She has a borderline hemoglobin A1c. She is overweight. She has hypertension which looks like it's been poorly controlled. Her lipids in 2014 were remarkably good with a total cholesterol 154 and LDL less than 100 with an HDL 45.  She has no symptoms of angina or chest pain. 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 She denies any palpitations. She denies orthopnea, PND or edema. She is compliant with her medications. She takes atenolol at night and hydrochlorothiazide in the morning.  EKG in 2011 showed ST-T wave changes in the inferior leads. Her most recent EKG this year shows similar changes but also now ST segment changes in the lateral leads. This may just be LVH with strain.    Past Medical History  Diagnosis Date  . Hypertension   . Glaucoma   . Anemia   . Obesity   . Pneumonia     2008    Current Outpatient Prescriptions  Medication Sig Dispense Refill  . atenolol (TENORMIN) 100 MG tablet Take 1 tablet (100 mg total) by mouth daily. 30 tablet 6  . famotidine (PEPCID) 20 MG tablet Take 1 tablet (20 mg total) by mouth daily. (Patient taking differently: Take 20 mg by mouth daily as needed. ) 60 tablet 6  . hydrocortisone ointment 0.5 % Apply 1 application topically 2 (two) times daily. 30 g 2  . hydrOXYzine (ATARAX/VISTARIL) 25 MG tablet Take 1 tablet (25 mg total) by mouth every 8 (eight) hours as needed for itching. 60 tablet 0  . ibuprofen (ADVIL,MOTRIN) 200 MG tablet Take 400 mg by mouth every 6 (six) hours as needed for headache.    . ibuprofen  (ADVIL,MOTRIN) 600 MG tablet Take 1 tablet (600 mg total) by mouth every 8 (eight) hours as needed. 30 tablet 0  . iron polysaccharides (NIFEREX) 150 MG capsule Take 1 capsule (150 mg total) by mouth daily. 30 capsule 5  . megestrol (MEGACE) 40 MG tablet Take 1 tablet (40 mg total) by mouth 2 (two) times daily. 60 tablet 5  . travoprost, benzalkonium, (TRAVATAN) 0.004 % ophthalmic solution Place 1 drop into both eyes at bedtime.    . butalbital-acetaminophen-caffeine (FIORICET) 50-325-40 MG per tablet Take 1 tablet by mouth 3 (three) times daily with meals as needed for headache. (Patient not taking: Reported on 07/18/2014) 20 tablet 0  . traMADol (ULTRAM) 50 MG tablet Take 1 tablet (50 mg total) by mouth every 8 (eight) hours as needed. (Patient not taking: Reported on 06/14/2014) 30 tablet 0   No current facility-administered medications for this visit.    Allergies  Allergen Reactions  . Oxycodone-Acetaminophen Hives  . Tylox [Oxycodone-Acetaminophen] Hives    Family History  Problem Relation Age of Onset  . Diabetes Mother   . Kidney disease Mother   . Heart attack Mother   . Diabetes Sister   . Heart attack Sister   . Diabetes Brother   . Heart disease Brother   . Heart attack Brother     History   Social History  . Marital Status: Married  Spouse Name: N/A  . Number of Children: N/A  . Years of Education: N/A   Occupational History  . Not on file.   Social History Main Topics  . Smoking status: Former Smoker    Quit date: 06/18/2014  . Smokeless tobacco: Not on file  . Alcohol Use: Not on file     Comment: toddy  now  and  then  . Drug Use: No  . Sexual Activity: Yes    Birth Control/ Protection: Surgical   Other Topics Concern  . Not on file   Social History Narrative   ** Merged History Encounter **        ROS ALL NEGATIVE EXCEPT THOSE NOTED IN HPI  PE  General Appearance: well developed, well nourished in no acute distress, obese HEENT:  symmetrical face, PERRLA, poor dentition Neck: no JVD, thyromegaly, or adenopathy, trachea midline Chest: symmetric without deformity Cardiac: PMI poorly appreciated, RRR, normal S1, S2, no gallop or murmur Lung: clear to ausculation and percussion Vascular: all pulses full without bruits  Abdominal: nondistended, nontender, good bowel sounds,  Extremities: no cyanosis, clubbing or edema, no sign of DVT, no varicosities  Skin: normal color, no rashes Neuro: alert and oriented x 3, non-focal Pysch: normal affect  EKG  BMET    Component Value Date/Time   NA 135 06/22/2014 1145   K 4.3 06/22/2014 1145   CL 108 06/22/2014 1145   CO2 24 06/22/2014 1145   GLUCOSE 93 06/22/2014 1145   BUN 12 06/22/2014 1145   CREATININE 0.97 06/22/2014 1145   CREATININE 0.77 07/24/2013 1506   CALCIUM 9.3 06/22/2014 1145   GFRNONAA 67* 06/22/2014 1145   GFRNONAA >89 07/24/2013 1506   GFRAA 78* 06/22/2014 1145   GFRAA >89 07/24/2013 1506    Lipid Panel     Component Value Date/Time   CHOL 154 04/25/2013 1439   TRIG 86 04/25/2013 1439   HDL 45 04/25/2013 1439   CHOLHDL 3.4 04/25/2013 1439   VLDL 17 04/25/2013 1439   LDLCALC 92 04/25/2013 1439    CBC    Component Value Date/Time   WBC 4.3 06/22/2014 1145   RBC 4.28 06/22/2014 1145   HGB 11.8* 06/22/2014 1145   HCT 35.9* 06/22/2014 1145   PLT 234 06/22/2014 1145   MCV 83.9 06/22/2014 1145   MCH 27.6 06/22/2014 1145   MCHC 32.9 06/22/2014 1145   RDW 14.9 06/22/2014 1145   LYMPHSABS 1.3 04/17/2014 1311   MONOABS 0.6 04/17/2014 1311   EOSABS 0.1 04/17/2014 1311   BASOSABS 0.0 04/17/2014 1311

## 2014-07-18 NOTE — Telephone Encounter (Signed)
Patient had office visit with Dr. Verl Blalock today and 2D echo without contrast ordered.  Insurance authorization needed. Authorization obtained from [facility] of -Authorization # [id number] (Effective 07/18/14-08/16/14).  Appointment held for 07/20/14 at 1400. Patient indicates time does not work because of her work schedule. Patient given Vascular contact number [phone]) so appointment could be scheduled that fits with her work schedule. Patient verbalized understanding.

## 2014-07-20 ENCOUNTER — Ambulatory Visit: Payer: BLUE CROSS/BLUE SHIELD | Admitting: Obstetrics & Gynecology

## 2014-07-20 ENCOUNTER — Other Ambulatory Visit (HOSPITAL_COMMUNITY): Payer: BLUE CROSS/BLUE SHIELD

## 2014-07-25 IMAGING — US US PELVIS COMPLETE
1 series · 13 of 25 positions shown · non-contrast
Comparison: None

CLINICAL DATA: Pain.

EXAM:
TRANSABDOMINAL AND TRANSVAGINAL ULTRASOUND OF PELVIS
TECHNIQUE: Both transabdominal and transvaginal ultrasound examinations of the
pelvis were performed. Transabdominal technique was performed for
global imaging of the pelvis including uterus, ovaries, adnexal
regions, and pelvic cul-de-sac. It was necessary to proceed with
endovaginal exam following the transabdominal exam to visualize the
uterus and adnexa..

[Series 1: us pelvis complete · 0.22mm/px · 13 of 64 slices shown]
[im 1/64]
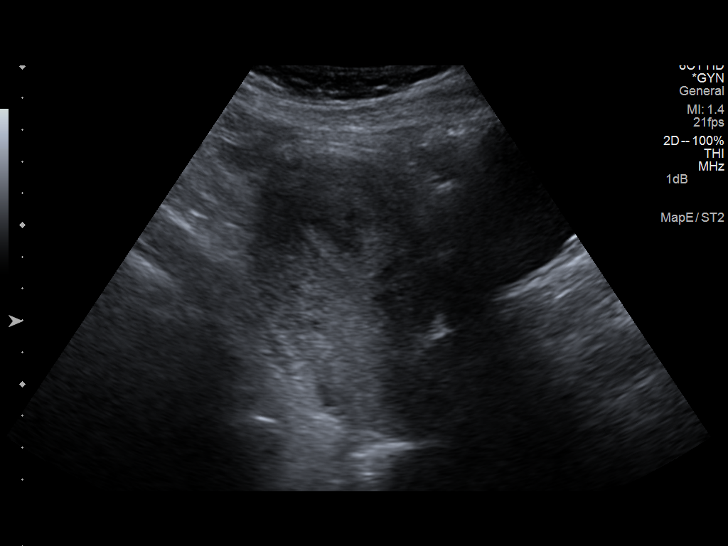
[im 6/64]
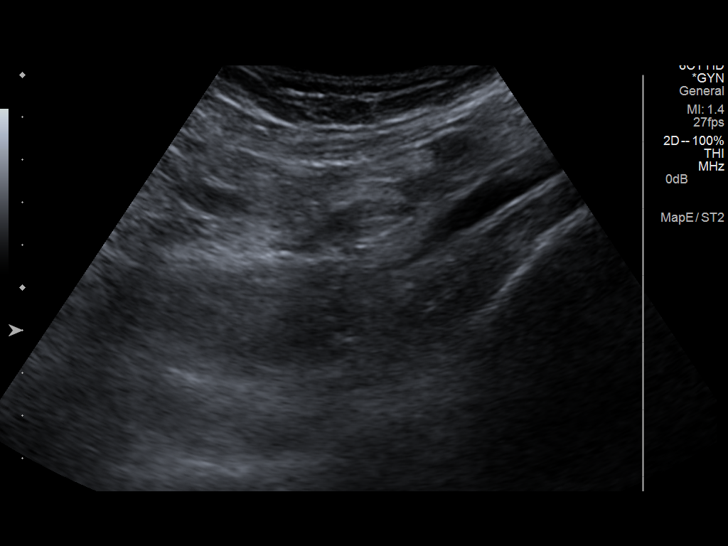
[im 11/64]
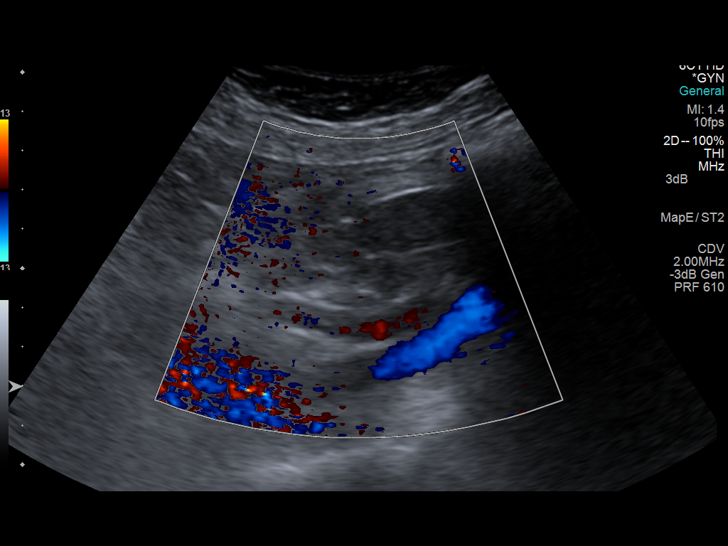
[im 16/64]
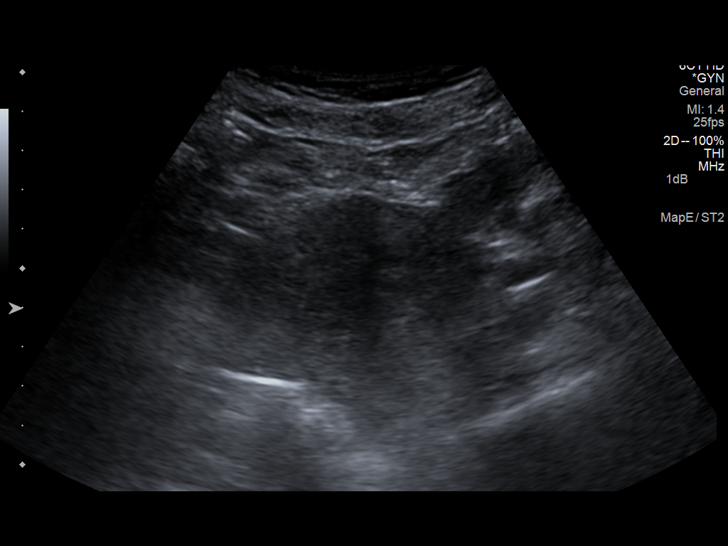
[im 22/64]
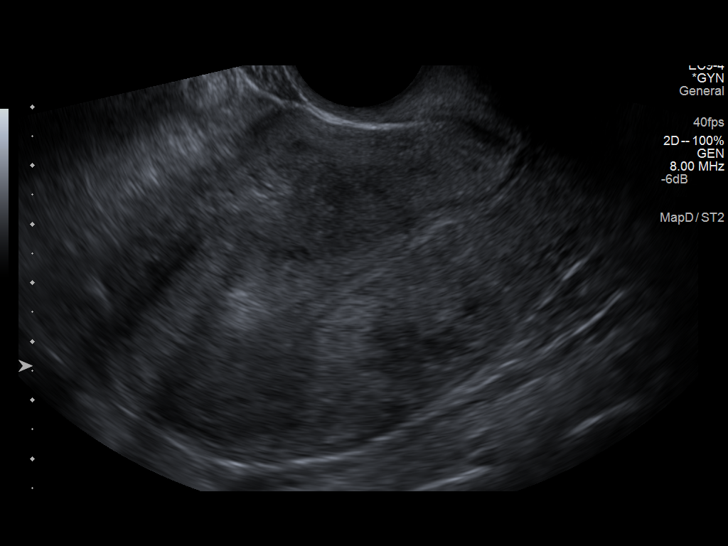
[im 27/64]
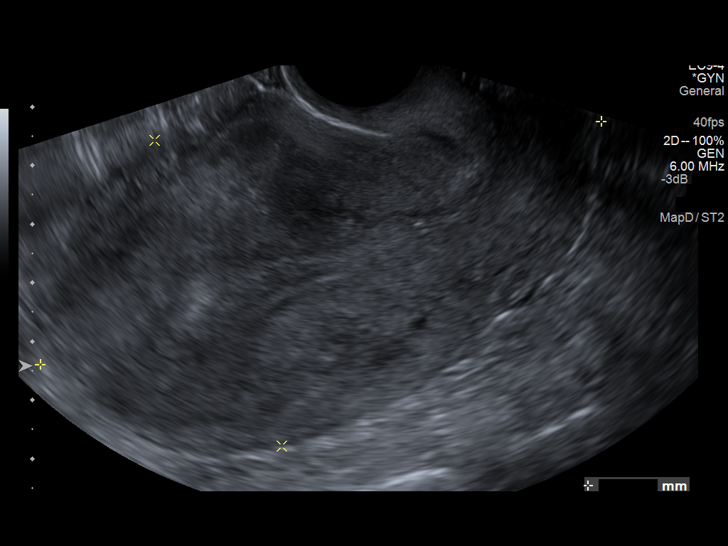
[im 32/64]
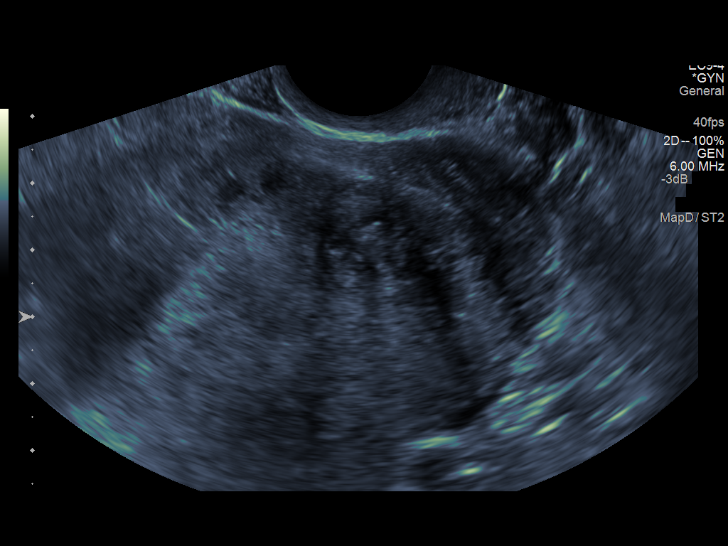
[im 37/64]
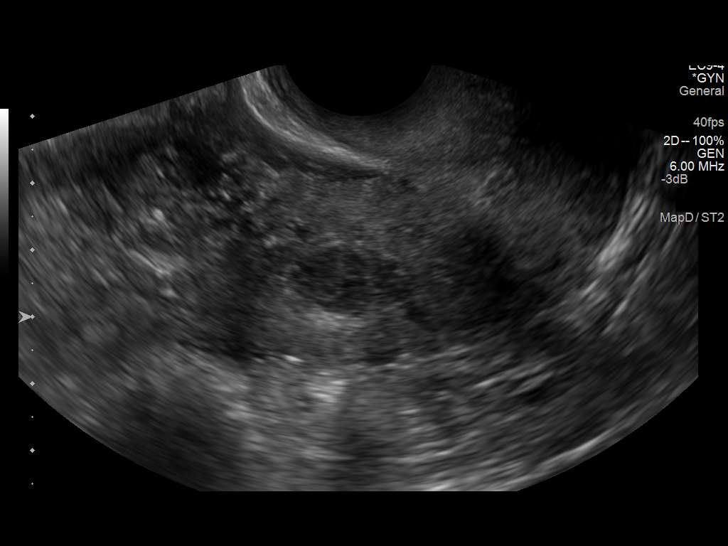
[im 43/64]
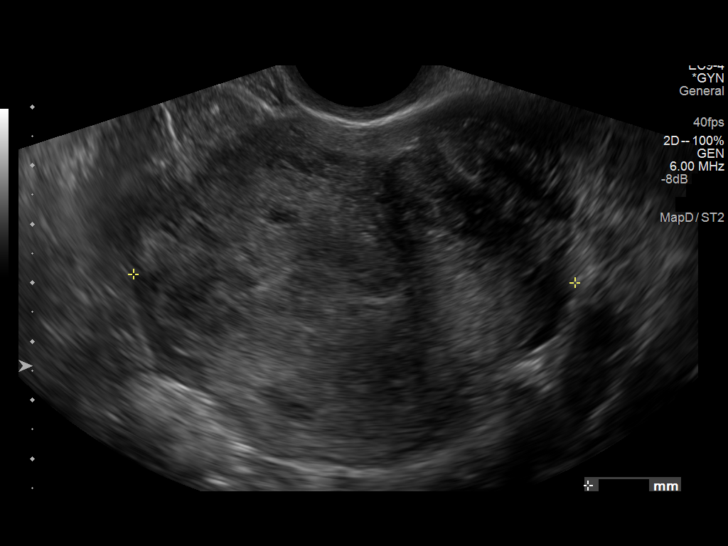
[im 48/64]
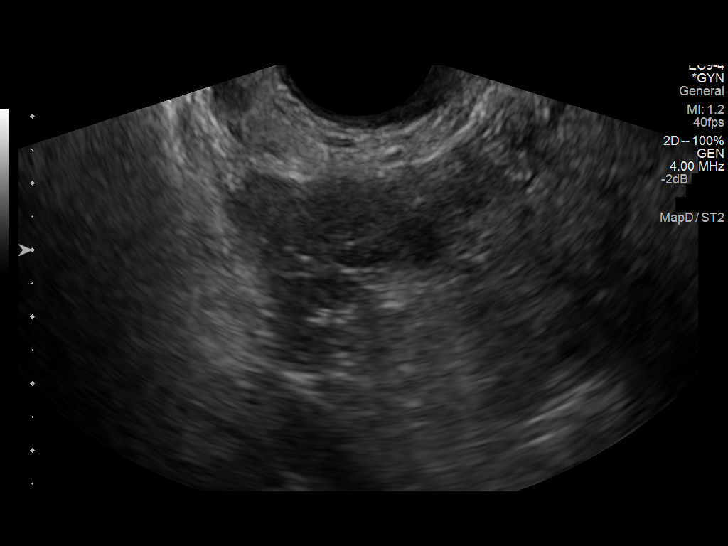
[im 53/64]
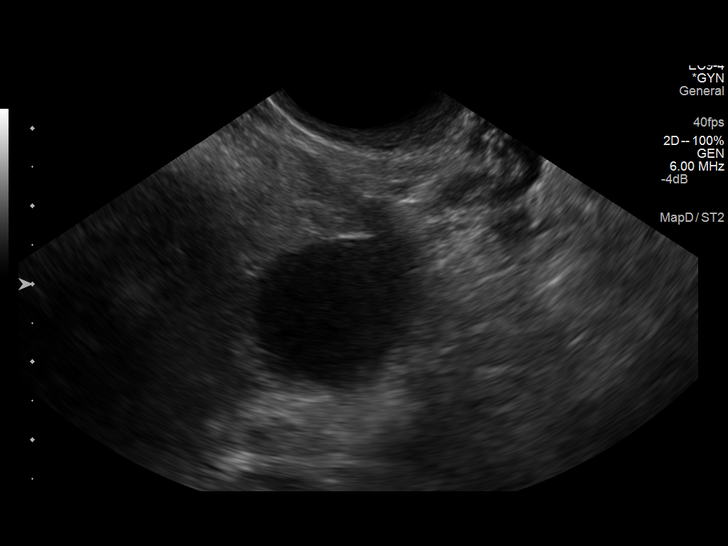
[im 58/64]
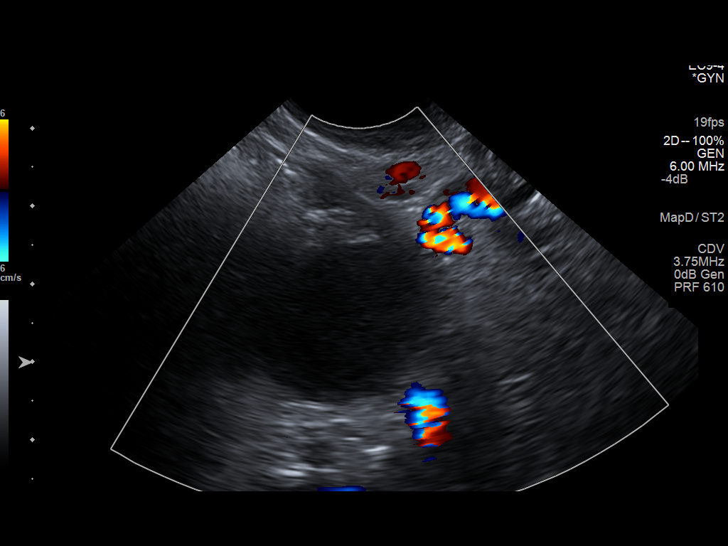
[im 64/64]
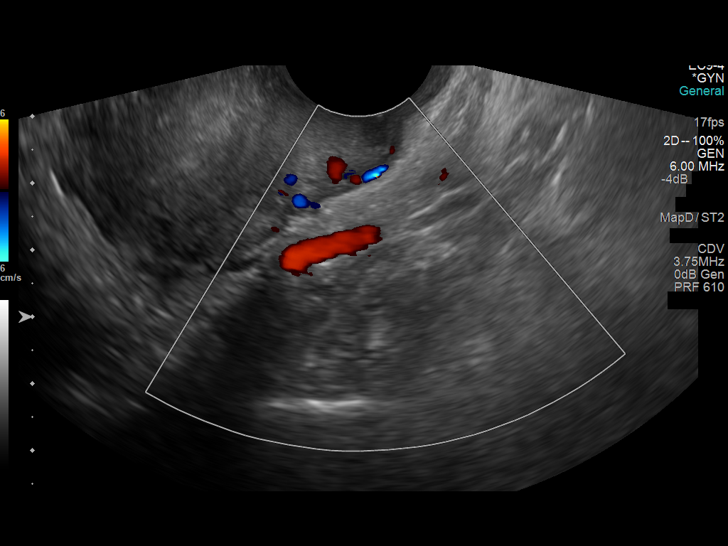

[13 of 25 positions shown; findings below may reference images not displayed]

FINDINGS: Uterus

Measurements: 10.4 x 5.6 x 7.5 cm. 2.6 cm maximum diameter
hypoechoic nodular density noted in the anterior aspect of the
uterus most likely fibroid. A 1.3 cm maximum diameter hypoechoic
nodular density in the right lateral portion of the of upper portion
of the uterus also most likely a fibroid.

Endometrium

Thickness: 14 mm..  No focal abnormality visualized.

Right ovary

Measurements: 3.0 x 2.1 x 2.6 cm. Normal appearance/no adnexal mass.

Left ovary

Measurements: 3.1 x 2.6 x 2.8 cm. 2.2 x 1.8 x 1.6 cm simple left
ovarian cyst.

Other findings

No free fluid.
IMPRESSION: 1. Fibroid uterus.
2. 2.2 x 1.8 x 1.6 cm simple left ovarian cyst. This is almost
certainly benign, but follow up ultrasound is recommended in 1 year
according to the Society of Radiologists in [id number] Consensus
Conference Statement (Lesya Aujla et al. Management of Asymptomatic
Ovarian and Other Adnexal Cysts Imaged at US: Society of
Radiologists in Ultrasound Consensus Conference Statement 7595.
Radiology [DATE]): [id number].).

## 2014-07-26 ENCOUNTER — Ambulatory Visit (HOSPITAL_COMMUNITY): Payer: BLUE CROSS/BLUE SHIELD

## 2014-07-26 ENCOUNTER — Ambulatory Visit: Payer: BLUE CROSS/BLUE SHIELD

## 2014-07-27 ENCOUNTER — Ambulatory Visit (HOSPITAL_COMMUNITY)
Admission: RE | Admit: 2014-07-27 | Discharge: 2014-07-27 | Disposition: A | Discharge status: Home / Self Care | Payer: BLUE CROSS/BLUE SHIELD | Source: Ambulatory Visit | Attending: Cardiology | Admitting: Cardiology

## 2014-07-27 ENCOUNTER — Ambulatory Visit: Payer: BLUE CROSS/BLUE SHIELD | Attending: Internal Medicine

## 2014-07-27 DIAGNOSIS — R9431 Abnormal electrocardiogram [ECG] [EKG]: Secondary | ICD-10-CM

## 2014-07-27 DIAGNOSIS — I10 Essential (primary) hypertension: Secondary | ICD-10-CM | POA: Insufficient documentation

## 2014-07-27 DIAGNOSIS — Z01810 Encounter for preprocedural cardiovascular examination: Principal | ICD-10-CM

## 2014-07-27 DIAGNOSIS — Z87891 Personal history of nicotine dependence: Secondary | ICD-10-CM | POA: Insufficient documentation

## 2014-07-27 LAB — BASIC METABOLIC PANEL
BUN: 13 mg/dL (ref 6–23)
CHLORIDE: 104 meq/L (ref 96–112)
CO2: 23 meq/L (ref 19–32)
CREATININE: 1.04 mg/dL (ref 0.50–1.10)
Calcium: 9.5 mg/dL (ref 8.4–10.5)
GLUCOSE: 97 mg/dL (ref 70–99)
POTASSIUM: 5 meq/L (ref 3.5–5.3)
Sodium: 138 mEq/L (ref 135–145)

## 2014-07-27 NOTE — Progress Notes (Signed)
  Echocardiogram 2D Echocardiogram has been performed.  Connie Bryant 07/27/2014, 1:35 PM

## 2014-07-31 ENCOUNTER — Telehealth: Payer: Self-pay | Admitting: General Practice

## 2014-07-31 NOTE — Telephone Encounter (Signed)
Patient called to request blood work results and also the results of the Echocardiogram. Please f/u with pt.

## 2014-08-01 ENCOUNTER — Telehealth: Payer: Self-pay

## 2014-08-01 NOTE — Telephone Encounter (Signed)
-----   Message from Renella Cunas, MD sent at 08/01/2014 10:06 AM EDT ----- Echocardiogram is normal. Cleared for surgery. No further cardiac evaluation.

## 2014-08-01 NOTE — Telephone Encounter (Signed)
Patient informed echo normal and that she is cleared for surgery.  Surgery clearance letter printed, signed by Dr. Verl Blalock, and filed at front office for patient pick-up. Patient aware and verbalized understanding.

## 2014-09-05 ENCOUNTER — Telehealth: Payer: Self-pay | Admitting: Internal Medicine

## 2014-09-05 NOTE — Telephone Encounter (Signed)
Patient called to request a med refill for famotidine (PEPCID) 20 MG tablet and atenolol (TENORMIN) 100 MG tablet. Please f/u with pt.

## 2014-09-06 ENCOUNTER — Other Ambulatory Visit: Payer: Self-pay | Admitting: Internal Medicine

## 2014-09-06 MED ORDER — ATENOLOL 100 MG PO TABS: 100.0000 mg | ORAL_TABLET | Freq: Every day | ORAL | Status: DC

## 2014-09-06 MED ORDER — FAMOTIDINE 20 MG PO TABS: 20.0000 mg | ORAL_TABLET | Freq: Every day | ORAL | Status: DC

## 2014-09-06 NOTE — Telephone Encounter (Signed)
I have called patient, her medication have been filled

## 2014-09-06 NOTE — Telephone Encounter (Signed)
Pt calling to follow up on medication refill. Pt has not been able to take bp med since yesterday, please f/u with pt ASAP.

## 2014-09-06 NOTE — Telephone Encounter (Signed)
Patient called to request a med refill for famotidine (PEPCID) 20 MG tablet and atenolol (TENORMIN) 100 MG tablet. Patient stated that she could not take her blood pressure pill yesterday. Please f/u with pt.

## 2015-02-11 ENCOUNTER — Emergency Department (HOSPITAL_COMMUNITY)
Admission: EM | Admit: 2015-02-11 | Discharge: 2015-02-11 | Disposition: A | Discharge status: Home / Self Care | Payer: BLUE CROSS/BLUE SHIELD | Attending: Emergency Medicine | Admitting: Emergency Medicine

## 2015-02-11 ENCOUNTER — Emergency Department (HOSPITAL_COMMUNITY): Payer: BLUE CROSS/BLUE SHIELD

## 2015-02-11 ENCOUNTER — Encounter (HOSPITAL_COMMUNITY): Payer: Self-pay | Admitting: Family Medicine

## 2015-02-11 DIAGNOSIS — S8992XA Unspecified injury of left lower leg, initial encounter: Secondary | ICD-10-CM | POA: Diagnosis not present

## 2015-02-11 DIAGNOSIS — E669 Obesity, unspecified: Secondary | ICD-10-CM | POA: Insufficient documentation

## 2015-02-11 DIAGNOSIS — D649 Anemia, unspecified: Secondary | ICD-10-CM | POA: Diagnosis not present

## 2015-02-11 DIAGNOSIS — Z87891 Personal history of nicotine dependence: Secondary | ICD-10-CM | POA: Diagnosis not present

## 2015-02-11 DIAGNOSIS — Z8701 Personal history of pneumonia (recurrent): Secondary | ICD-10-CM | POA: Diagnosis not present

## 2015-02-11 DIAGNOSIS — Z7952 Long term (current) use of systemic steroids: Secondary | ICD-10-CM | POA: Diagnosis not present

## 2015-02-11 DIAGNOSIS — Y998 Other external cause status: Secondary | ICD-10-CM | POA: Insufficient documentation

## 2015-02-11 DIAGNOSIS — Y9389 Activity, other specified: Secondary | ICD-10-CM | POA: Insufficient documentation

## 2015-02-11 DIAGNOSIS — Z79899 Other long term (current) drug therapy: Secondary | ICD-10-CM | POA: Diagnosis not present

## 2015-02-11 DIAGNOSIS — X58XXXA Exposure to other specified factors, initial encounter: Secondary | ICD-10-CM | POA: Insufficient documentation

## 2015-02-11 DIAGNOSIS — I10 Essential (primary) hypertension: Secondary | ICD-10-CM | POA: Insufficient documentation

## 2015-02-11 DIAGNOSIS — H409 Unspecified glaucoma: Secondary | ICD-10-CM | POA: Insufficient documentation

## 2015-02-11 DIAGNOSIS — M25562 Pain in left knee: Secondary | ICD-10-CM

## 2015-02-11 DIAGNOSIS — Y9289 Other specified places as the place of occurrence of the external cause: Secondary | ICD-10-CM | POA: Insufficient documentation

## 2015-02-11 MED ORDER — TRAMADOL HCL 50 MG PO TABS: 50.0000 mg | ORAL_TABLET | Freq: Four times a day (QID) | ORAL | Status: DC | PRN

## 2015-02-11 MED ORDER — IBUPROFEN 600 MG PO TABS: 600.0000 mg | ORAL_TABLET | Freq: Four times a day (QID) | ORAL | Status: DC | PRN

## 2015-02-11 NOTE — ED Provider Notes (Signed)
CSN: [id number]     Arrival date & time 02/11/15  0906 History  This chart was scribed for non-physician practitioner, Clayton Bibles, PA-C, working with Evelina Bucy, MD by Terressa Koyanagi, ED Scribe. This patient was seen in room TR07C/TR07C and the patient's care was started at 11:23 AM.   Chief Complaint  Patient presents with  . Knee Injury   HPI PCP: Lance Bosch, NP HPI Comments: Connie Bryant is a 49 y.o. female, with PMHx noted below, who presents to the Emergency Department complaining of traumatic, sudden onset, left knee pain with associated swelling onset yesterday at 8am when pt turned suddenly and heard a pop on her left knee. Pain is located over the medial left knee and is worse with weight bearing, described as pulling. Pt denies fever, chest pain, SOB, numbness/tingling of LE. Pt notes she is able to bear weight on her left leg, however, it is painful to do so.   Past Medical History  Diagnosis Date  . Hypertension   . Glaucoma   . Anemia   . Obesity   . Pneumonia     2008   Past Surgical History  Procedure Laterality Date  . Tubal ligation     Family History  Problem Relation Age of Onset  . Diabetes Mother   . Kidney disease Mother   . Heart attack Mother   . Diabetes Sister   . Heart attack Sister   . Diabetes Brother   . Heart disease Brother   . Heart attack Brother    Social History  Substance Use Topics  . Smoking status: Former Smoker    Quit date: 06/18/2014  . Smokeless tobacco: None  . Alcohol Use: None     Comment: toddy  now  and  then   OB History    Gravida Para Term Preterm AB TAB SAB Ectopic Multiple Living   2 2        2      Review of Systems  Constitutional: Negative for fever.  Respiratory: Negative for shortness of breath.   Cardiovascular: Negative for chest pain.  Musculoskeletal: Positive for joint swelling (left knee) and arthralgias (left knee).  Skin: Negative for color change.  Allergic/Immunologic: Negative for  immunocompromised state.  Neurological: Negative for numbness.  Hematological: Does not bruise/bleed easily.   Allergies  Oxycodone-acetaminophen and Tylox  Home Medications   Prior to Admission medications   Medication Sig Start Date End Date Taking? Authorizing Provider  atenolol (TENORMIN) 100 MG tablet Take 1 tablet (100 mg total) by mouth daily. 09/06/14   Lance Bosch, NP  famotidine (PEPCID) 20 MG tablet Take 1 tablet (20 mg total) by mouth daily. 09/06/14   Lance Bosch, NP  hydrocortisone ointment 0.5 % Apply 1 application topically 2 (two) times daily. 07/24/13   Reyne Dumas, MD  hydrOXYzine (ATARAX/VISTARIL) 25 MG tablet Take 1 tablet (25 mg total) by mouth every 8 (eight) hours as needed for itching. 07/24/13   Reyne Dumas, MD  ibuprofen (ADVIL,MOTRIN) 200 MG tablet Take 400 mg by mouth every 6 (six) hours as needed for headache.    Historical Provider, MD  ibuprofen (ADVIL,MOTRIN) 600 MG tablet Take 1 tablet (600 mg total) by mouth every 8 (eight) hours as needed. 08/11/13   Robbie Lis, MD  iron polysaccharides (NIFEREX) 150 MG capsule Take 1 capsule (150 mg total) by mouth daily. 04/16/14   Adysen Raphael Filbert, MD  lisinopril-hydrochlorothiazide (PRINZIDE,ZESTORETIC) 20-25 MG per tablet Take 1  tablet by mouth daily. 07/18/14   Renella Cunas, MD  megestrol (MEGACE) 40 MG tablet Take 1 tablet (40 mg total) by mouth 2 (two) times daily. 04/16/14   Fredericka Bottcher Filbert, MD  traMADol (ULTRAM) 50 MG tablet Take 1 tablet (50 mg total) by mouth every 8 (eight) hours as needed. Patient not taking: Reported on 06/14/2014 07/24/13   Reyne Dumas, MD  travoprost, benzalkonium, (TRAVATAN) 0.004 % ophthalmic solution Place 1 drop into both eyes at bedtime.    Historical Provider, MD   Triage Vitals: BP 117/79 mmHg  Pulse 62  Temp(Src) 98.6 F (37 C) (Oral)  Resp 20  SpO2 99% Physical Exam  Constitutional: She appears well-developed and well-nourished. No distress.  HENT:  Head: Normocephalic and  atraumatic.  Neck: Neck supple.  Pulmonary/Chest: Effort normal.  Musculoskeletal: She exhibits tenderness.  Full AROM of left knee.  Tender of medial aspect of left knee. No erythema or warmth. Na laxity of the joint; or pain with stress of any direction. Pt ambulates by hopping and toe touching; unable to bear much weight on left.   Neurological: She is alert.  Skin: She is not diaphoretic.  Nursing note and vitals reviewed.  ED Course  Procedures (including critical care time) DIAGNOSTIC STUDIES: Oxygen Saturation is 99% on ra, nl by my interpretation.    COORDINATION OF CARE: 11:26 AM: Discussed treatment plan which includes knee brace, ortho referral, crutches, RICE and pain meds with pt at bedside; patient verbalizes understanding and agrees with treatment plan.  Imaging Review Dg Knee Complete 4 Views Left  02/11/2015   CLINICAL DATA:  Acute left knee pain after twisting injury last night. Initial encounter.  EXAM: LEFT KNEE - COMPLETE 4+ VIEW  COMPARISON:  None.  FINDINGS: There is no evidence of fracture, dislocation, or joint effusion. Mild narrowing of the medial lateral joint spaces is noted with osteophyte formation consistent with degenerative joint disease. Soft tissues are unremarkable.  IMPRESSION: Mild degenerative joint disease. No acute abnormality seen in the left knee.   Electronically Signed   By: Marijo Conception, M.D.   On: 02/11/2015 10:07   I have personally reviewed and evaluated these images as part of my medical decision-making.  MDM   Final diagnoses:  Left knee pain   Afebrile, nontoxic patient with left knee pain after planted twisting injury. Neurovascularly intact.  Concern for meniscal injury.  Xray shows DJD.    D/C home with knee immobilizer, crutches, orthopedic follow up, tramadol, motrin.  Discussed result, findings, treatment, and follow up  with patient.  Pt given return precautions.  Pt verbalizes understanding and agrees with plan.       I  personally performed the services described in this documentation, which was scribed in my presence. The recorded information has been reviewed and is accurate.    Clayton Bibles, PA-C 02/11/15 1420  Evelina Bucy, MD 02/11/15 1524

## 2015-02-11 NOTE — Discharge Instructions (Signed)
Read the information below.  Use the prescribed medication as directed.  Please discuss all new medications with your pharmacist.  You may return to the Emergency Department at any time for worsening condition or any new symptoms that concern you.  If you develop uncontrolled pain, weakness or numbness of the extremity, severe discoloration of the skin, or you are unable to walk or bend your knee, return to the ER for a recheck.      Knee Pain The knee is the complex joint between your thigh and your lower leg. It is made up of bones, tendons, ligaments, and cartilage. The bones that make up the knee are:  The femur in the thigh.  The tibia and fibula in the lower leg.  The patella or kneecap riding in the groove on the lower femur. CAUSES  Knee pain is a common complaint with many causes. A few of these causes are:  Injury, such as:  A ruptured ligament or tendon injury.  Torn cartilage.  Medical conditions, such as:  Gout  Arthritis  Infections  Overuse, over training, or overdoing a physical activity. Knee pain can be minor or severe. Knee pain can accompany debilitating injury. Minor knee problems often respond well to self-care measures or get well on their own. More serious injuries may need medical intervention or even surgery. SYMPTOMS The knee is complex. Symptoms of knee problems can vary widely. Some of the problems are:  Pain with movement and weight bearing.  Swelling and tenderness.  Buckling of the knee.  Inability to straighten or extend your knee.  Your knee locks and you cannot straighten it.  Warmth and redness with pain and fever.  Deformity or dislocation of the kneecap. DIAGNOSIS  Determining what is wrong may be very straight forward such as when there is an injury. It can also be challenging because of the complexity of the knee. Tests to make a diagnosis may include:  Your caregiver taking a history and doing a physical exam.  Routine X-rays  can be used to rule out other problems. X-rays will not reveal a cartilage tear. Some injuries of the knee can be diagnosed by:  Arthroscopy a surgical technique by which a small video camera is inserted through tiny incisions on the sides of the knee. This procedure is used to examine and repair internal knee joint problems. Tiny instruments can be used during arthroscopy to repair the torn knee cartilage (meniscus).  Arthrography is a radiology technique. A contrast liquid is directly injected into the knee joint. Internal structures of the knee joint then become visible on X-ray film.  An MRI scan is a non X-ray radiology procedure in which magnetic fields and a computer produce two- or three-dimensional images of the inside of the knee. Cartilage tears are often visible using an MRI scanner. MRI scans have largely replaced arthrography in diagnosing cartilage tears of the knee.  Blood work.  Examination of the fluid that helps to lubricate the knee joint (synovial fluid). This is done by taking a sample out using a needle and a syringe. TREATMENT The treatment of knee problems depends on the cause. Some of these treatments are:  Depending on the injury, proper casting, splinting, surgery, or physical therapy care will be needed.  Give yourself adequate recovery time. Do not overuse your joints. If you begin to get sore during workout routines, back off. Slow down or do fewer repetitions.  For repetitive activities such as cycling or running, maintain your strength and  nutrition.  Alternate muscle groups. For example, if you are a weight lifter, work the upper body on one day and the lower body the next.  Either tight or weak muscles do not give the proper support for your knee. Tight or weak muscles do not absorb the stress placed on the knee joint. Keep the muscles surrounding the knee strong.  Take care of mechanical problems.  If you have flat feet, orthotics or special shoes may  help. See your caregiver if you need help.  Arch supports, sometimes with wedges on the inner or outer aspect of the heel, can help. These can shift pressure away from the side of the knee most bothered by osteoarthritis.  A brace called an "unloader" brace also may be used to help ease the pressure on the most arthritic side of the knee.  If your caregiver has prescribed crutches, braces, wraps or ice, use as directed. The acronym for this is PRICE. This means protection, rest, ice, compression, and elevation.  Nonsteroidal anti-inflammatory drugs (NSAIDs), can help relieve pain. But if taken immediately after an injury, they may actually increase swelling. Take NSAIDs with food in your stomach. Stop them if you develop stomach problems. Do not take these if you have a history of ulcers, stomach pain, or bleeding from the bowel. Do not take without your caregiver's approval if you have problems with fluid retention, heart failure, or kidney problems.  For ongoing knee problems, physical therapy may be helpful.  Glucosamine and chondroitin are over-the-counter dietary supplements. Both may help relieve the pain of osteoarthritis in the knee. These medicines are different from the usual anti-inflammatory drugs. Glucosamine may decrease the rate of cartilage destruction.  Injections of a corticosteroid drug into your knee joint may help reduce the symptoms of an arthritis flare-up. They may provide pain relief that lasts a few months. You may have to wait a few months between injections. The injections do have a small increased risk of infection, water retention, and elevated blood sugar levels.  Hyaluronic acid injected into damaged joints may ease pain and provide lubrication. These injections may work by reducing inflammation. A series of shots may give relief for as long as 6 months.  Topical painkillers. Applying certain ointments to your skin may help relieve the pain and stiffness of  osteoarthritis. Ask your pharmacist for suggestions. Many over the-counter products are approved for temporary relief of arthritis pain.  In some countries, doctors often prescribe topical NSAIDs for relief of chronic conditions such as arthritis and tendinitis. A review of treatment with NSAID creams found that they worked as well as oral medications but without the serious side effects. PREVENTION  Maintain a healthy weight. Extra pounds put more strain on your joints.  Get strong, stay limber. Weak muscles are a common cause of knee injuries. Stretching is important. Include flexibility exercises in your workouts.  Be smart about exercise. If you have osteoarthritis, chronic knee pain or recurring injuries, you may need to change the way you exercise. This does not mean you have to stop being active. If your knees ache after jogging or playing basketball, consider switching to swimming, water aerobics, or other low-impact activities, at least for a few days a week. Sometimes limiting high-impact activities will provide relief.  Make sure your shoes fit well. Choose footwear that is right for your sport.  Protect your knees. Use the proper gear for knee-sensitive activities. Use kneepads when playing volleyball or laying carpet. Buckle your seat belt every  time you drive. Most shattered kneecaps occur in car accidents.  Rest when you are tired. SEEK MEDICAL CARE IF:  You have knee pain that is continual and does not seem to be getting better.  SEEK IMMEDIATE MEDICAL CARE IF:  Your knee joint feels hot to the touch and you have a high fever. MAKE SURE YOU:   Understand these instructions.  Will watch your condition.  Will get help right away if you are not doing well or get worse. Document Released: 03/01/2007 Document Revised: 07/27/2011 Document Reviewed: 03/01/2007 [facility] Patient Information 2015 [place], [place]. This information is not intended to replace advice given to you by  your health care provider. Make sure you discuss any questions you have with your health care provider.  Knee Bracing Knee braces are supports to help stabilize and protect an injured or painful knee. They come in many different styles. They should support and protect the knee without increasing the chance of other injuries to yourself or others. It is important not to have a false sense of security when using a brace. Knee braces that help you to keep using your knee:  Do not restore normal knee stability under high stress forces.  May decrease some aspects of athletic performance. Some of the different types of knee braces are:  Prophylactic knee braces are designed to prevent or reduce the severity of knee injuries during sports that make injury to the knee more likely.  Rehabilitative knee braces are designed to allow protected motion of:  Injured knees.  Knees that have been treated with or without surgery. There is no evidence that the use of a supportive knee brace protects the graft following a successful anterior cruciate ligament (ACL) reconstruction. However, braces are sometimes used to:   Protect injured ligaments.  Control knee movement during the initial healing period. They may be used as part of the treatment program for the various injured ligaments or cartilage of the knee including the:  Anterior cruciate ligament.  Medial collateral ligament.  Medial or lateral cartilage (meniscus).  Posterior cruciate ligament.  Lateral collateral ligament. Rehabilitative knee braces are most commonly used:  During crutch-assisted walking right after injury.  During crutch-assisted walking right after surgery to repair the cartilage and/or cruciate ligament injury.  For a short period of time, 2-8 weeks, after the injury or surgery. The value of a rehabilitative brace as opposed to a cast or splint includes the:  Ability to adjust the brace for swelling.  Ability to  remove the brace for examinations, icing, or showering.  Ability to allow for movement in a controlled range of motion. Functional knee braces give support to knees that have already been injured. They are designed to provide stability for the injured knee and provide protection after repair. Functional knee braces may not affect performance much. Lower extremity muscle strengthening, flexibility, and improvement in technique are more important than bracing in treating ligamentous knee injuries. Functional braces are not a substitute for rehabilitation or surgical procedures. Unloader/off-loader braces are designed to provide pain relief in arthritic knees. Patients with wear and tear arthritis from growing old or from an old cartilage injury (osteoarthritis) of the knee, and bowlegged (varus) or knock-knee (valgus) deformities, often develop increased pain in the arthritic side due to increased loading. Unloader/off-loader braces are made to reduce uneven loading in such knees. There is reduction in bowing out movement in bowlegged knees when the correct unloader brace is used. Patients with advanced osteoarthritis or severe varus or  valgus alignment problems would not likely benefit from bracing. Patellofemoral braces help the kneecap to move smoothly and well centered over the end of the femur in the knee.  Most people who wear knee braces feel that they help. However, there is a lack of scientific evidence that knee braces are helpful at the level needed for athletic participation to prevent injury. In spite of this, athletes report an increase in knee stability, pain relief, performance improvement, and confidence during athletics when using a brace.  Different knee problems require different knee braces:  Your caregiver may suggest one kind of knee brace after knee surgery.  A caregiver may choose another kind of knee brace for support instead of surgery for some types of torn ligaments.  You may  also need one for pain in the front of your knee that is not getting better with strengthening and flexibility exercises. Get your caregiver's advice if you want to try a knee brace. The caregiver will advise you on where to get them and provide a prescription when it is needed to fashion and/or fit the brace. Knee braces are the least important part of preventing knee injuries or getting better following injury. Stretching, strengthening and technique improvement are far more important in caring for and preventing knee injuries. When strengthening your knee, increase your activities a little at a time so as not to develop injuries from overuse. Work out an exercise plan with your caregiver and/or physical therapist to get the best program for you. Do not let a knee brace become a crutch. Always remember, there are no braces which support the knee as well as your original ligaments and cartilage you were born with. Conditioning, proper warm-up, and stretching remain the most important parts of keeping your knees healthy. HOW TO USE A KNEE BRACE  During sports, knee braces should be used as directed by your caregiver.  Make sure that the hinges are where the knee bends.  Straps, tapes, or hook-and-loop tapes should be fastened around your leg as instructed.  You should check the placement of the brace during activities to make sure that it has not moved. Poorly positioned braces can hurt rather than help you.  To work well, a knee brace should be worn during all activities that put you at risk of knee injury.  Warm up properly before beginning athletic activities. HOME CARE INSTRUCTIONS  Knee braces often get damaged during normal use. Replace worn-out braces for maximum benefit.  Clean regularly with soap and water.  Inspect your brace often for wear and tear.  Cover exposed metal to protect others from injury.  Durable materials may cost more, but last longer. SEEK IMMEDIATE MEDICAL CARE  IF:   Your knee seems to be getting worse rather than better.  You have increasing pain or swelling in the knee.  You have problems caused by the knee brace.  You have increased swelling or inflammation (redness or soreness) in your knee.  Your knee becomes warm and more painful and you develop an unexplained temperature over 101F (38.3C). MAKE SURE YOU:   Understand these instructions.  Will watch your condition.  Will get help right away if you are not doing well or get worse. See your caregiver, physical therapist, or orthopedic surgeon for additional information. Document Released: 07/25/2003 Document Revised: 09/18/2013 Document Reviewed: 10/31/2008 [facility] Patient Information 2015 [place], [place]. This information is not intended to replace advice given to you by your health care provider. Make sure you discuss any  questions you have with your health care provider.

## 2015-02-11 NOTE — ED Notes (Signed)
Pt sts she turned really fast yesterday and felt left knee pop and sts possibly moved out of place. Now knee swollen.

## 2015-02-11 NOTE — ED Notes (Signed)
Declined W/C at D/C and was escorted to lobby by RN. 

## 2015-04-30 ENCOUNTER — Other Ambulatory Visit: Payer: Self-pay | Admitting: Obstetrics & Gynecology

## 2015-05-06 ENCOUNTER — Telehealth: Payer: Self-pay | Admitting: *Deleted

## 2015-05-06 NOTE — Telephone Encounter (Signed)
Patient called and stated that she is waiting on a refill on her megestrol. [facility] states that they have not heard from Dr. Hulan Fray. Patient would like a call back to find out what she needs to do.

## 2015-05-07 NOTE — Telephone Encounter (Signed)
Offered patient appt since she has not been seen in while. Pt states she will call back to scheduled appt.

## 2015-05-08 ENCOUNTER — Ambulatory Visit: Payer: BLUE CROSS/BLUE SHIELD | Admitting: Obstetrics and Gynecology

## 2015-07-09 ENCOUNTER — Other Ambulatory Visit: Payer: Self-pay | Admitting: Internal Medicine

## 2016-01-21 ENCOUNTER — Telehealth: Payer: Self-pay | Admitting: Internal Medicine

## 2016-01-21 NOTE — Telephone Encounter (Signed)
Patient has not been seen in over a year and a half. She need follow up before we can refill any medications.

## 2016-01-21 NOTE — Telephone Encounter (Signed)
Pt. Called requesting a refill on lisinopril-hydrochlorothiazide (PRINZIDE,ZESTORETIC) 20-25 MG per tablet  Pt. Would like the Rx to be sent to [facility] on [address]. Please f/u

## 2016-02-03 NOTE — Telephone Encounter (Signed)
Will forward request to Dr. Doreene Burke for approval.

## 2016-02-03 NOTE — Telephone Encounter (Signed)
Pt called the office again to get an appt and medication refill. There are no appts available. Pt is on her last bp medication. She understands that she needs an appt but can she have a refill fro lisinopril-hydrochlorothiazide (PRINZIDE,ZESTORETIC) 20-25 MG per tablet  until appt. She will call back Mon 9/25 to schedule. [facility] [address]. Please follow up.  Thank you.

## 2016-02-11 ENCOUNTER — Other Ambulatory Visit: Payer: Self-pay | Admitting: Internal Medicine

## 2016-02-11 MED ORDER — LISINOPRIL-HYDROCHLOROTHIAZIDE 20-25 MG PO TABS: 1.0000 | ORAL_TABLET | Freq: Every day | ORAL | 3 refills | Status: DC

## 2016-02-11 NOTE — Telephone Encounter (Signed)
Medical Assistant left message on patient's home and cell voicemail. Voicemail states to give a call back to [place] with [facility] at [phone].  !!!Please inform patient of prescription being refilled and the importance of patient scheduling an appointment for further refills!!!

## 2016-02-11 NOTE — Telephone Encounter (Signed)
Refilled and ready for pick[facility]up at [facility]

## 2016-04-28 ENCOUNTER — Ambulatory Visit: Payer: BLUE CROSS/BLUE SHIELD | Attending: Internal Medicine | Admitting: Internal Medicine

## 2016-04-28 ENCOUNTER — Encounter: Payer: Self-pay | Admitting: Internal Medicine

## 2016-04-28 VITALS — BP 119/81 | HR 85 | Temp 98.4°F | Resp 16 | Wt 212.0 lb

## 2016-04-28 DIAGNOSIS — Z87891 Personal history of nicotine dependence: Secondary | ICD-10-CM | POA: Insufficient documentation

## 2016-04-28 DIAGNOSIS — Z1231 Encounter for screening mammogram for malignant neoplasm of breast: Secondary | ICD-10-CM

## 2016-04-28 DIAGNOSIS — Z79899 Other long term (current) drug therapy: Secondary | ICD-10-CM | POA: Insufficient documentation

## 2016-04-28 DIAGNOSIS — Z720 Tobacco use: Secondary | ICD-10-CM

## 2016-04-28 DIAGNOSIS — Z1211 Encounter for screening for malignant neoplasm of colon: Secondary | ICD-10-CM | POA: Diagnosis not present

## 2016-04-28 DIAGNOSIS — I10 Essential (primary) hypertension: Secondary | ICD-10-CM | POA: Insufficient documentation

## 2016-04-28 DIAGNOSIS — D509 Iron deficiency anemia, unspecified: Secondary | ICD-10-CM | POA: Insufficient documentation

## 2016-04-28 DIAGNOSIS — Z131 Encounter for screening for diabetes mellitus: Secondary | ICD-10-CM | POA: Insufficient documentation

## 2016-04-28 DIAGNOSIS — N920 Excessive and frequent menstruation with regular cycle: Secondary | ICD-10-CM | POA: Diagnosis not present

## 2016-04-28 DIAGNOSIS — N921 Excessive and frequent menstruation with irregular cycle: Secondary | ICD-10-CM

## 2016-04-28 DIAGNOSIS — Z1239 Encounter for other screening for malignant neoplasm of breast: Secondary | ICD-10-CM

## 2016-04-28 DIAGNOSIS — Z114 Encounter for screening for human immunodeficiency virus [HIV]: Secondary | ICD-10-CM

## 2016-04-28 DIAGNOSIS — Z1329 Encounter for screening for other suspected endocrine disorder: Secondary | ICD-10-CM | POA: Diagnosis not present

## 2016-04-28 LAB — CBC WITH DIFFERENTIAL/PLATELET
Basophils Absolute: 0 cells/uL (ref 0–200)
Basophils Relative: 0 %
EOS PCT: 3 %
Eosinophils Absolute: 138 cells/uL (ref 15–500)
HEMATOCRIT: 36.3 % (ref 35.0–45.0)
Hemoglobin: 11.9 g/dL (ref 11.7–15.5)
LYMPHS PCT: 27 %
Lymphs Abs: 1242 cells/uL (ref 850–3900)
MCH: 28.1 pg (ref 27.0–33.0)
MCHC: 32.8 g/dL (ref 32.0–36.0)
MCV: 85.8 fL (ref 80.0–100.0)
MONO ABS: 460 {cells}/uL (ref 200–950)
MPV: 10.2 fL (ref 7.5–12.5)
Monocytes Relative: 10 %
NEUTROS PCT: 60 %
Neutro Abs: 2760 cells/uL (ref 1500–7800)
Platelets: 259 10*3/uL (ref 140–400)
RBC: 4.23 MIL/uL (ref 3.80–5.10)
RDW: 14.4 % (ref 11.0–15.0)
WBC: 4.6 10*3/uL (ref 3.8–10.8)

## 2016-04-28 LAB — POCT GLYCOSYLATED HEMOGLOBIN (HGB A1C): HEMOGLOBIN A1C: 5.5

## 2016-04-28 LAB — TSH: TSH: 0.83 mIU/L

## 2016-04-28 MED ORDER — METOPROLOL TARTRATE 25 MG PO TABS: 25.0000 mg | ORAL_TABLET | Freq: Two times a day (BID) | ORAL | 3 refills | Status: DC

## 2016-04-28 MED ORDER — LISINOPRIL-HYDROCHLOROTHIAZIDE 20-25 MG PO TABS: 1.0000 | ORAL_TABLET | Freq: Every day | ORAL | 3 refills | Status: DC

## 2016-04-28 MED ORDER — FAMOTIDINE 20 MG PO TABS: 20.0000 mg | ORAL_TABLET | Freq: Two times a day (BID) | ORAL | 2 refills | Status: DC

## 2016-04-28 NOTE — Progress Notes (Signed)
Connie Bryant, is a 50 y.o. female  [id number]  [id number]  DOB - [birth year]  CC:  Chief Complaint  Patient presents with  . Establish Care       HPI: Connie Bryant is a 50 y.o. female here today to establish medical care.   Last seen in clinic 2/16, per pt has had hard time getting appt, but also her mother recently died from ESRD and she was not taking care of her health as she should.  She is taking all her meds, notes that the atenolol 100 makes her very sleepy though.  Has not had any more menses since 2 /16 when she at time have heavy menses from uterine fibroids, and was being worked up for partial hysterectomy by Dr Hulan Fray, OB.  States she is still taking iron, and has some megace pills still.  Co of right arm soreness, worse w/ cold weather.    Patient has No headache, No chest pain, No abdominal pain - No Nausea, No new weakness tingling or numbness, No Cough - SOB.    Review of Systems: Per hpi, o/w all systems reviewed and negative.   Allergies  Allergen Reactions  . Oxycodone-Acetaminophen Hives  . Tylox [Oxycodone-Acetaminophen] Hives   Past Medical History:  Diagnosis Date  . Anemia   . Glaucoma   . Hypertension   . Obesity   . Pneumonia    2008   Current Outpatient Prescriptions on File Prior to Visit  Medication Sig Dispense Refill  . hydrocortisone ointment 0.5 % Apply 1 application topically 2 (two) times daily. 30 g 2  . hydrOXYzine (ATARAX/VISTARIL) 25 MG tablet Take 1 tablet (25 mg total) by mouth every 8 (eight) hours as needed for itching. 60 tablet 0  . ibuprofen (ADVIL,MOTRIN) 600 MG tablet Take 1 tablet (600 mg total) by mouth every 6 (six) hours as needed for mild pain or moderate pain. 15 tablet 0  . iron polysaccharides (NIFEREX) 150 MG capsule Take 1 capsule (150 mg total) by mouth daily. 30 capsule 5  . megestrol (MEGACE) 40 MG tablet TAKE 1 TABLET BY MOUTH TWICE A DAY 60 tablet 5  . traMADol (ULTRAM) 50 MG tablet Take 1  tablet (50 mg total) by mouth every 6 (six) hours as needed for moderate pain or severe pain. 15 tablet 0  . travoprost, benzalkonium, (TRAVATAN) 0.004 % ophthalmic solution Place 1 drop into both eyes at bedtime.     No current facility-administered medications on file prior to visit.    Family History  Problem Relation Age of Onset  . Diabetes Mother   . Kidney disease Mother   . Heart attack Mother   . Diabetes Sister   . Heart attack Sister   . Diabetes Brother   . Heart disease Brother   . Heart attack Brother    Social History   Social History  . Marital status: Married    Spouse name: N/A  . Number of children: N/A  . Years of education: N/A   Occupational History  . Not on file.   Social History Main Topics  . Smoking status: Former Smoker    Quit date: 06/18/2014  . Smokeless tobacco: Not on file  . Alcohol use Not on file     Comment: toddy  now  and  then  . Drug use: No  . Sexual activity: Yes    Birth control/ protection: Surgical   Other Topics Concern  . Not on file   Social History  Narrative   ** Merged History Encounter **        Objective:   Vitals:   04/28/16 1457  BP: 119/81  Pulse: 85  Resp: 16  Temp: 98.4 F (36.9 C)    Filed Weights   04/28/16 1457  Weight: 212 lb (96.2 kg)    BP Readings from Last 3 Encounters:  04/28/16 119/81  02/11/15 110/71  07/18/14 135/87    Physical Exam: Constitutional: Patient appears well-developed and well-nourished. No distress. AAOx3, obese, pleasant. HENT: Normocephalic, atraumatic, External right and left ear normal. Oropharynx is clear and moist.   Mild nonobstructing ceruminosis, bilat TMs clear. Eyes: Conjunctivae and EOM are normal. PERRL, no scleral icterus. Neck: Normal ROM. Neck supple. No JVD. Neck fullness, but no obvious thyroidmegaly. CVS: RRR, S1/S2 +, no murmurs, no gallops, no carotid bruit.  Pulmonary: Effort and breath sounds normal, no stridor, rhonchi, wheezes, rales.    Abdominal: Soft. BS +, no distension, tenderness, rebound or guarding.  Musculoskeletal: Normal range of motion. No edema and no tenderness.  LE: bilat/ no c/c/e, pulses 2+ bilateral. Neuro: Alert.  muscle tone coordination wnl. No cranial nerve deficit grossly. Skin: Skin is warm and dry. No rash noted. Not diaphoretic. No erythema. No pallor. Psychiatric: Normal mood and affect. Behavior, judgment, thought content normal.  Lab Results  Component Value Date   WBC 4.3 06/22/2014   HGB 11.8 (L) 06/22/2014   HCT 35.9 (L) 06/22/2014   MCV 83.9 06/22/2014   PLT 234 06/22/2014   Lab Results  Component Value Date   CREATININE 1.04 07/27/2014   BUN 13 07/27/2014   NA 138 07/27/2014   K 5.0 07/27/2014   CL 104 07/27/2014   CO2 23 07/27/2014    Lab Results  Component Value Date   HGBA1C 5.2 04/25/2013   Lipid Panel     Component Value Date/Time   CHOL 154 04/25/2013 1439   TRIG 86 04/25/2013 1439   HDL 45 04/25/2013 1439   CHOLHDL 3.4 04/25/2013 1439   VLDL 17 04/25/2013 1439   LDLCALC 92 04/25/2013 1439        Depression screen PHQ 2/9 04/28/2016  Decreased Interest 0  Down, Depressed, Hopeless 2  PHQ - 2 Score 2  Altered sleeping 1  Tired, decreased energy 2  Change in appetite 1  Feeling bad or failure about yourself  0  Trouble concentrating 0  Moving slowly or fidgety/restless 0  Suicidal thoughts 0  PHQ-9 Score 6    Assessment and plan:   1. HTN (hypertension), benign Well controlled, shortage of atenolol currently. - pt amendable to changing to metorprolol tartrate 25bid, may have less drowsy effect. - continue prinzide 20-25 qd - low salt diet discussed - BASIC METABOLIC PANEL WITH GFR - Lipid Panel, last ate this am.  2. Iron deficiency anemia, unspecified iron deficiency anemia type 2nd to heavy menses prior, no periods since 2/16 per pt. - CBC with Differential - Iron, TIBC and Ferritin Panel - Ambulatory referral to Gynecology  3. Thyroid  disorder screen, neck fullness noted. - TSH  4. Colon cancer screening - Ambulatory referral to Gastroenterology  5. Encounter for screening for HIV - HIV antibody (with reflex)  6. Breast cancer screening - MM Digital Screening; Future  7. Diabetes mellitus screening - HgB A1c  8. Menorrhagia with irregular cycle - Ambulatory referral to Gynecology - still on megace prn Per pt, and iron supplements - chk cbc, may be postmenopausal if no menses since 2/16.  9. tob abus Total cessation recd, tips discussed  Return in about 3 months (around 07/27/2016).  The patient was given clear instructions to go to ER or return to medical center if symptoms don't improve, worsen or new problems develop. The patient verbalized understanding. The patient was told to call to get lab results if they haven't heard anything in the next week.    This note has been created with Surveyor, quantity. Any transcriptional errors are unintentional.   Maren Reamer, MD, [place], [place]   04/28/2016, 3:39 PM

## 2016-04-28 NOTE — Patient Instructions (Addendum)
Hypertension Hypertension is another name for high blood pressure. High blood pressure forces your heart to work harder to pump blood. A blood pressure reading has two numbers, which includes a higher number over a lower number (example: 110/72). Follow these instructions at home:  Have your blood pressure rechecked by your doctor.  Only take medicine as told by your doctor. Follow the directions carefully. The medicine does not work as well if you skip doses. Skipping doses also puts you at risk for problems.  Do not smoke.  Monitor your blood pressure at home as told by your doctor. Contact a doctor if:  You think you are having a reaction to the medicine you are taking.  You have repeat headaches or feel dizzy.  You have puffiness (swelling) in your ankles.  You have trouble with your vision. Get help right away if:  You get a very bad headache and are confused.  You feel weak, numb, or faint.  You get chest or belly (abdominal) pain.  You throw up (vomit).  You cannot breathe very well. This information is not intended to replace advice given to you by your health care provider. Make sure you discuss any questions you have with your health care provider. Document Released: 10/21/2007 Document Revised: 10/10/2015 Document Reviewed: 02/24/2013 Elsevier Interactive Patient Education  2017 [employer] Inc[employer]  -  Low-Sodium Eating Plan Sodium raises blood pressure and causes water to be held in the body. Getting less sodium from food will help lower your blood pressure, reduce any swelling, and protect your heart, liver, and kidneys. We get sodium by adding salt (sodium chloride) to food. Most of our sodium comes from canned, boxed, and frozen foods. Restaurant foods, fast foods, and pizza are also very high in sodium. Even if you take medicine to lower your blood pressure or to reduce fluid in your body, getting less sodium from your food is important. What is my plan? Most people  should limit their sodium intake to 2,300 mg a day. Your health care provider recommends that you limit your sodium intake to 2,047m a day. What do I need to know about this eating plan? For the low-sodium eating plan, you will follow these general guidelines:  Choose foods with a % Daily Value for sodium of less than 5% (as listed on the food label).  Use salt-free seasonings or herbs instead of table salt or sea salt.  Check with your health care provider or pharmacist before using salt substitutes.  Eat fresh foods.  Eat more vegetables and fruits.  Limit canned vegetables. If you do use them, rinse them well to decrease the sodium.  Limit cheese to 1 oz (28 g) per day.  Eat lower-sodium products, often labeled as "lower sodium" or "no salt added."  Avoid foods that contain monosodium glutamate (MSG). MSG is sometimes added to [place] and some canned foods.  Check food labels (Nutrition Facts labels) on foods to learn how much sodium is in one serving.  Eat more home-cooked food and less restaurant, buffet, and fast food.  When eating at a restaurant, ask that your food be prepared with less salt, or no salt if possible. How do I read food labels for sodium information? The Nutrition Facts label lists the amount of sodium in one serving of the food. If you eat more than one serving, you must multiply the listed amount of sodium by the number of servings. Food labels may also identify foods as:  Sodium free-Less than  5 mg in a serving.  Very low sodium-35 mg or less in a serving.  Low sodium-140 mg or less in a serving.  Light in sodium-50% less sodium in a serving. For example, if a food that usually has 300 mg of sodium is changed to become light in sodium, it will have 150 mg of sodium.  Reduced sodium-25% less sodium in a serving. For example, if a food that usually has 400 mg of sodium is changed to reduced sodium, it will have 300 mg of sodium. What foods can I  eat? Grains  Low-sodium cereals, including oats, puffed wheat and rice, and shredded wheat cereals. Low-sodium crackers. Unsalted rice and pasta. Lower-sodium bread. Vegetables  Frozen or fresh vegetables. Low-sodium or reduced-sodium canned vegetables. Low-sodium or reduced-sodium tomato sauce and paste. Low-sodium or reduced-sodium tomato and vegetable juices. Fruits  Fresh, frozen, and canned fruit. Fruit juice. Meat and Other Protein Products  Low-sodium canned tuna and salmon. Fresh or frozen meat, poultry, seafood, and fish. Lamb. Unsalted nuts. Dried beans, peas, and lentils without added salt. Unsalted canned beans. Homemade soups without salt. Eggs. Dairy  Milk. Soy milk. Ricotta cheese. Low-sodium or reduced-sodium cheeses. Yogurt. Condiments  Fresh and dried herbs and spices. Salt-free seasonings. Onion and garlic powders. Low-sodium varieties of mustard and ketchup. Fresh or refrigerated horseradish. Lemon juice. Fats and Oils  Reduced-sodium salad dressings. Unsalted butter. Other  Unsalted popcorn and pretzels. The items listed above may not be a complete list of recommended foods or beverages. Contact your dietitian for more options.  What foods are not recommended? Grains  Instant hot cereals. Bread stuffing, pancake, and biscuit mixes. Croutons. Seasoned rice or pasta mixes. Noodle soup cups. Boxed or frozen macaroni and cheese. Self-rising flour. Regular salted crackers. Vegetables  Regular canned vegetables. Regular canned tomato sauce and paste. Regular tomato and vegetable juices. Frozen vegetables in sauces. Salted [place] fries. Olives. Angie Fava. Relishes. Sauerkraut. Salsa. Meat and Other Protein Products  Salted, canned, smoked, spiced, or pickled meats, seafood, or fish. Bacon, ham, sausage, hot dogs, corned beef, chipped beef, and packaged luncheon meats. Salt pork. Jerky. Pickled herring. Anchovies, regular canned tuna, and sardines. Salted nuts. Dairy  Processed  cheese and cheese spreads. Cheese curds. Blue cheese and cottage cheese. Buttermilk. Condiments  Onion and garlic salt, seasoned salt, table salt, and sea salt. Canned and packaged gravies. Worcestershire sauce. Tartar sauce. Barbecue sauce. Teriyaki sauce. Soy sauce, including reduced sodium. Steak sauce. Fish sauce. Oyster sauce. Cocktail sauce. Horseradish that you find on the shelf. Regular ketchup and mustard. Meat flavorings and tenderizers. Bouillon cubes. Hot sauce. Tabasco sauce. Marinades. Taco seasonings. Relishes. Fats and Oils  Regular salad dressings. Salted butter. Margarine. Ghee. Bacon fat. Other  Potato and tortilla chips. Corn chips and puffs. Salted popcorn and pretzels. Canned or dried soups. Pizza. Frozen entrees and pot pies. The items listed above may not be a complete list of foods and beverages to avoid. Contact your dietitian for more information.  This information is not intended to replace advice given to you by your health care provider. Make sure you discuss any questions you have with your health care provider. Document Released: 10/24/2001 Document Revised: 10/10/2015 Document Reviewed: 03/08/2013 Elsevier Interactive Patient Education  2017 [employer], Female Introduction Adopting a healthy lifestyle and getting preventive care can go a long way to promote health and wellness. Talk with your health care provider about what schedule of regular examinations is right for you. This is  a good chance for you to check in with your provider about disease prevention and staying healthy. In between checkups, there are plenty of things you can do on your own. Experts have done a lot of research about which lifestyle changes and preventive measures are most likely to keep you healthy. Ask your health care provider for more information. Weight and diet Eat a healthy diet  Be sure to include plenty of vegetables, fruits, low-fat dairy products, and  lean protein.  Do not eat a lot of foods high in solid fats, added sugars, or salt.  Get regular exercise. This is one of the most important things you can do for your health.  Most adults should exercise for at least 150 minutes each week. The exercise should increase your heart rate and make you sweat (moderate-intensity exercise).  Most adults should also do strengthening exercises at least twice a week. This is in addition to the moderate-intensity exercise. Maintain a healthy weight  Body mass index (BMI) is a measurement that can be used to identify possible weight problems. It estimates body fat based on height and weight. Your health care provider can help determine your BMI and help you achieve or maintain a healthy weight.  For females 68 years of age and older:  A BMI below 18.5 is considered underweight.  A BMI of 18.5 to 24.9 is normal.  A BMI of 25 to 29.9 is considered overweight.  A BMI of 30 and above is considered obese. Watch levels of cholesterol and blood lipids  You should start having your blood tested for lipids and cholesterol at 51 years of age, then have this test every 5 years.  You may need to have your cholesterol levels checked more often if:  Your lipid or cholesterol levels are high.  You are older than 51 years of age.  You are at high risk for heart disease. Cancer screening Lung Cancer  Lung cancer screening is recommended for adults 7-60 years old who are at high risk for lung cancer because of a history of smoking.  A yearly low-dose CT scan of the lungs is recommended for people who:  Currently smoke.  Have quit within the past 15 years.  Have at least a 30-pack-year history of smoking. A pack year is smoking an average of one pack of cigarettes a day for 1 year.  Yearly screening should continue until it has been 15 years since you quit.  Yearly screening should stop if you develop a health problem that would prevent you from  having lung cancer treatment. Breast Cancer  Practice breast self-awareness. This means understanding how your breasts normally appear and feel.  It also means doing regular breast self-exams. Let your health care provider know about any changes, no matter how small.  If you are in your 20s or 30s, you should have a clinical breast exam (CBE) by a health care provider every 1-3 years as part of a regular health exam.  If you are 23 or older, have a CBE every year. Also consider having a breast X-ray (mammogram) every year.  If you have a family history of breast cancer, talk to your health care provider about genetic screening.  If you are at high risk for breast cancer, talk to your health care provider about having an MRI and a mammogram every year.  Breast cancer gene (BRCA) assessment is recommended for women who have family members with BRCA-related cancers. BRCA-related cancers include:  Breast.  Ovarian.  Tubal.  Peritoneal cancers.  Results of the assessment will determine the need for genetic counseling and BRCA1 and BRCA2 testing. Cervical Cancer  Your health care provider may recommend that you be screened regularly for cancer of the pelvic organs (ovaries, uterus, and vagina). This screening involves a pelvic examination, including checking for microscopic changes to the surface of your cervix (Pap test). You may be encouraged to have this screening done every 3 years, beginning at age 80.  For women ages 97-65, health care providers may recommend pelvic exams and Pap testing every 3 years, or they may recommend the Pap and pelvic exam, combined with testing for human papilloma virus (HPV), every 5 years. Some types of HPV increase your risk of cervical cancer. Testing for HPV may also be done on women of any age with unclear Pap test results.  Other health care providers may not recommend any screening for nonpregnant women who are considered low risk for pelvic cancer and  who do not have symptoms. Ask your health care provider if a screening pelvic exam is right for you.  If you have had past treatment for cervical cancer or a condition that could lead to cancer, you need Pap tests and screening for cancer for at least 20 years after your treatment. If Pap tests have been discontinued, your risk factors (such as having a new sexual partner) need to be reassessed to determine if screening should resume. Some women have medical problems that increase the chance of getting cervical cancer. In these cases, your health care provider may recommend more frequent screening and Pap tests. Colorectal Cancer  This type of cancer can be detected and often prevented.  Routine colorectal cancer screening usually begins at 51 years of age and continues through 51 years of age.  Your health care provider may recommend screening at an earlier age if you have risk factors for colon cancer.  Your health care provider may also recommend using home test kits to check for hidden blood in the stool.  A small camera at the end of a tube can be used to examine your colon directly (sigmoidoscopy or colonoscopy). This is done to check for the earliest forms of colorectal cancer.  Routine screening usually begins at age 7.  Direct examination of the colon should be repeated every 5-10 years through 51 years of age. However, you may need to be screened more often if early forms of precancerous polyps or small growths are found. Skin Cancer  Check your skin from head to toe regularly.  Tell your health care provider about any new moles or changes in moles, especially if there is a change in a mole's shape or color.  Also tell your health care provider if you have a mole that is larger than the size of a pencil eraser.  Always use sunscreen. Apply sunscreen liberally and repeatedly throughout the day.  Protect yourself by wearing long sleeves, pants, a wide-brimmed hat, and sunglasses  whenever you are outside. Heart disease, diabetes, and high blood pressure  High blood pressure causes heart disease and increases the risk of stroke. High blood pressure is more likely to develop in:  People who have blood pressure in the high end of the normal range (130-139/85-89 mm Hg).  People who are overweight or obese.  People who are African American.  If you are 65-7 years of age, have your blood pressure checked every 3-5 years. If you are 109 years of age or older, have your  blood pressure checked every year. You should have your blood pressure measured twice-once when you are at a hospital or clinic, and once when you are not at a hospital or clinic. Record the average of the two measurements. To check your blood pressure when you are not at a hospital or clinic, you can use:  An automated blood pressure machine at a pharmacy.  A home blood pressure monitor.  If you are between 65 years and 53 years old, ask your health care provider if you should take aspirin to prevent strokes.  Have regular diabetes screenings. This involves taking a blood sample to check your fasting blood sugar level.  If you are at a normal weight and have a low risk for diabetes, have this test once every three years after 51 years of age.  If you are overweight and have a high risk for diabetes, consider being tested at a younger age or more often. Preventing infection Hepatitis B  If you have a higher risk for hepatitis B, you should be screened for this virus. You are considered at high risk for hepatitis B if:  You were born in a country where hepatitis B is common. Ask your health care provider which countries are considered high risk.  Your parents were born in a high-risk country, and you have not been immunized against hepatitis B (hepatitis B vaccine).  You have HIV or AIDS.  You use needles to inject street drugs.  You live with someone who has hepatitis B.  You have had sex with  someone who has hepatitis B.  You get hemodialysis treatment.  You take certain medicines for conditions, including cancer, organ transplantation, and autoimmune conditions. Hepatitis C  Blood testing is recommended for:  Everyone born from [birth year] through 1966.  Anyone with known risk factors for hepatitis C. Sexually transmitted infections (STIs)  You should be screened for sexually transmitted infections (STIs) including gonorrhea and chlamydia if:  You are sexually active and are younger than 51 years of age.  You are older than 51 years of age and your health care provider tells you that you are at risk for this type of infection.  Your sexual activity has changed since you were last screened and you are at an increased risk for chlamydia or gonorrhea. Ask your health care provider if you are at risk.  If you do not have HIV, but are at risk, it may be recommended that you take a prescription medicine daily to prevent HIV infection. This is called pre-exposure prophylaxis (PrEP). You are considered at risk if:  You are sexually active and do not regularly use condoms or know the HIV status of your partner(s).  You take drugs by injection.  You are sexually active with a partner who has HIV. Talk with your health care provider about whether you are at high risk of being infected with HIV. If you choose to begin PrEP, you should first be tested for HIV. You should then be tested every 3 months for as long as you are taking PrEP. Pregnancy  If you are premenopausal and you may become pregnant, ask your health care provider about preconception counseling.  If you may become pregnant, take 400 to 800 micrograms (mcg) of folic acid every day.  If you want to prevent pregnancy, talk to your health care provider about birth control (contraception). Osteoporosis and menopause  Osteoporosis is a disease in which the bones lose minerals and strength with aging. This can result  in  serious bone fractures. Your risk for osteoporosis can be identified using a bone density scan.  If you are 43 years of age or older, or if you are at risk for osteoporosis and fractures, ask your health care provider if you should be screened.  Ask your health care provider whether you should take a calcium or vitamin D supplement to lower your risk for osteoporosis.  Menopause may have certain physical symptoms and risks.  Hormone replacement therapy may reduce some of these symptoms and risks. Talk to your health care provider about whether hormone replacement therapy is right for you. Follow these instructions at home:  Schedule regular health, dental, and eye exams.  Stay current with your immunizations.  Do not use any tobacco products including cigarettes, chewing tobacco, or electronic cigarettes.  If you are pregnant, do not drink alcohol.  If you are breastfeeding, limit how much and how often you drink alcohol.  Limit alcohol intake to no more than 1 drink per day for nonpregnant women. One drink equals 12 ounces of beer, 5 ounces of wine, or 1 ounces of hard liquor.  Do not use street drugs.  Do not share needles.  Ask your health care provider for help if you need support or information about quitting drugs.  Tell your health care provider if you often feel depressed.  Tell your health care provider if you have ever been abused or do not feel safe at home. This information is not intended to replace advice given to you by your health care provider. Make sure you discuss any questions you have with your health care provider. Document Released: 11/17/2010 Document Revised: 10/10/2015 Document Reviewed: 02/05/2015  2017 Elsevier  -  Steps to Quit Smoking Smoking tobacco can be bad for your health. It can also affect almost every organ in your body. Smoking puts you and people around you at risk for many serious long-lasting (chronic) diseases. Quitting smoking is  hard, but it is one of the best things that you can do for your health. It is never too late to quit. What are the benefits of quitting smoking? When you quit smoking, you lower your risk for getting serious diseases and conditions. They can include:  Lung cancer or lung disease.  Heart disease.  Stroke.  Heart attack.  Not being able to have children (infertility).  Weak bones (osteoporosis) and broken bones (fractures). If you have coughing, wheezing, and shortness of breath, those symptoms may get better when you quit. You may also get sick less often. If you are pregnant, quitting smoking can help to lower your chances of having a baby of low birth weight. What can I do to help me quit smoking? Talk with your doctor about what can help you quit smoking. Some things you can do (strategies) include:  Quitting smoking totally, instead of slowly cutting back how much you smoke over a period of time.  Going to in-person counseling. You are more likely to quit if you go to many counseling sessions.  Using resources and support systems, such as:  Online chats with a Social worker.  Phone quitlines.  Printed Furniture conservator/restorer.  Support groups or group counseling.  Text messaging programs.  Mobile phone apps or applications.  Taking medicines. Some of these medicines may have nicotine in them. If you are pregnant or breastfeeding, do not take any medicines to quit smoking unless your doctor says it is okay. Talk with your doctor about counseling or other things that  can help you. Talk with your doctor about using more than one strategy at the same time, such as taking medicines while you are also going to in-person counseling. This can help make quitting easier. What things can I do to make it easier to quit? Quitting smoking might feel very hard at first, but there is a lot that you can do to make it easier. Take these steps:  Talk to your family and friends. Ask them to support  and encourage you.  Call phone quitlines, reach out to support groups, or work with a Social worker.  Ask people who smoke to not smoke around you.  Avoid places that make you want (trigger) to smoke, such as:  Bars.  Parties.  Smoke-break areas at work.  Spend time with people who do not smoke.  Lower the stress in your life. Stress can make you want to smoke. Try these things to help your stress:  Getting regular exercise.  Deep-breathing exercises.  Yoga.  Meditating.  Doing a body scan. To do this, close your eyes, focus on one area of your body at a time from head to toe, and notice which parts of your body are tense. Try to relax the muscles in those areas.  Download or buy apps on your mobile phone or tablet that can help you stick to your quit plan. There are many free apps, such as QuitGuide from the [employer] Office manager for Disease Control and Prevention). You can find more support from smokefree.gov and other websites. This information is not intended to replace advice given to you by your health care provider. Make sure you discuss any questions you have with your health care provider. Document Released: 02/28/2009 Document Revised: 12/31/2015 Document Reviewed: 09/18/2014 Elsevier Interactive Patient Education  2017 [employer].

## 2016-04-29 ENCOUNTER — Other Ambulatory Visit: Payer: Self-pay | Admitting: Internal Medicine

## 2016-04-29 ENCOUNTER — Telehealth: Payer: Self-pay

## 2016-04-29 LAB — IRON,TIBC AND FERRITIN PANEL
%SAT: 23 % (ref 11–50)
Ferritin: 55 ng/mL (ref 10–232)
Iron: 63 ug/dL (ref 45–160)
TIBC: 274 ug/dL (ref 250–450)

## 2016-04-29 LAB — LIPID PANEL
CHOL/HDL RATIO: 5.2 ratio — AB (ref <5.0)
CHOLESTEROL: 183 mg/dL (ref <200)
HDL: 35 mg/dL — AB (ref >50)
LDL Cholesterol: 123 mg/dL — ABNORMAL HIGH (ref <100)
TRIGLYCERIDES: 125 mg/dL (ref <150)
VLDL: 25 mg/dL (ref <30)

## 2016-04-29 LAB — BASIC METABOLIC PANEL WITH GFR
BUN: 19 mg/dL (ref 7–25)
CO2: 25 mmol/L (ref 20–31)
CREATININE: 1.05 mg/dL (ref 0.50–1.05)
Calcium: 9.8 mg/dL (ref 8.6–10.4)
Chloride: 104 mmol/L (ref 98–110)
GFR, EST AFRICAN AMERICAN: 71 mL/min (ref >=60)
GFR, Est Non African American: 62 mL/min (ref >=60)
GLUCOSE: 76 mg/dL (ref 65–99)
Potassium: 4 mmol/L (ref 3.5–5.3)
Sodium: 138 mmol/L (ref 135–146)

## 2016-04-29 LAB — HIV ANTIBODY (ROUTINE TESTING W REFLEX): HIV 1&2 Ab, 4th Generation: NONREACTIVE

## 2016-04-29 NOTE — Telephone Encounter (Signed)
Contacted pt to go over lab results pt is aware and doesn't have any questions or concerns 

## 2016-06-15 ENCOUNTER — Encounter: Payer: Self-pay | Admitting: Internal Medicine

## 2016-06-18 ENCOUNTER — Ambulatory Visit (INDEPENDENT_AMBULATORY_CARE_PROVIDER_SITE_OTHER): Payer: BLUE CROSS/BLUE SHIELD | Admitting: Family Medicine

## 2016-06-18 ENCOUNTER — Encounter: Payer: Self-pay | Admitting: Family Medicine

## 2016-06-18 VITALS — BP 102/69 | HR 56 | Wt 208.1 lb

## 2016-06-18 DIAGNOSIS — N912 Amenorrhea, unspecified: Secondary | ICD-10-CM | POA: Diagnosis not present

## 2016-06-18 DIAGNOSIS — D500 Iron deficiency anemia secondary to blood loss (chronic): Secondary | ICD-10-CM

## 2016-06-18 DIAGNOSIS — Z23 Encounter for immunization: Secondary | ICD-10-CM

## 2016-06-18 NOTE — Patient Instructions (Signed)
Menopause Menopause is the normal time of life when menstrual periods stop completely. Menopause is complete when you have missed 12 consecutive menstrual periods. It usually occurs between the ages of 48 years and 55 years. Very rarely does a woman develop menopause before the age of 40 years. At menopause, your ovaries stop producing the female hormones estrogen and progesterone. This can cause undesirable symptoms and also affect your health. Sometimes the symptoms may occur 4-5 years before the menopause begins. There is no relationship between menopause and:  Oral contraceptives.  Number of children you had.  Race.  The age your menstrual periods started (menarche).  Heavy smokers and very thin women may develop menopause earlier in life. What are the causes?  The ovaries stop producing the female hormones estrogen and progesterone. Other causes include:  Surgery to remove both ovaries.  The ovaries stop functioning for no known reason.  Tumors of the pituitary gland in the brain.  Medical disease that affects the ovaries and hormone production.  Radiation treatment to the abdomen or pelvis.  Chemotherapy that affects the ovaries.  What are the signs or symptoms?  Hot flashes.  Night sweats.  Decrease in sex drive.  Vaginal dryness and thinning of the vagina causing painful intercourse.  Dryness of the skin and developing wrinkles.  Headaches.  Tiredness.  Irritability.  Memory problems.  Weight gain.  Bladder infections.  Hair growth of the face and chest.  Infertility. More serious symptoms include:  Loss of bone (osteoporosis) causing breaks (fractures).  Depression.  Hardening and narrowing of the arteries (atherosclerosis) causing heart attacks and strokes.  How is this diagnosed?  When the menstrual periods have stopped for 12 straight months.  Physical exam.  Hormone studies of the blood. How is this treated? There are many treatment  choices and nearly as many questions about them. The decisions to treat or not to treat menopausal changes is an individual choice made with your health care provider. Your health care provider can discuss the treatments with you. Together, you can decide which treatment will work best for you. Your treatment choices may include:  Hormone therapy (estrogen and progesterone).  Non-hormonal medicines.  Treating the individual symptoms with medicine (for example antidepressants for depression).  Herbal medicines that may help specific symptoms.  Counseling by a psychiatrist or psychologist.  Group therapy.  Lifestyle changes including: ? Eating healthy. ? Regular exercise. ? Limiting caffeine and alcohol. ? Stress management and meditation.  No treatment.  Follow these instructions at home:  Take the medicine your health care provider gives you as directed.  Get plenty of sleep and rest.  Exercise regularly.  Eat a diet that contains calcium (good for the bones) and soy products (acts like estrogen hormone).  Avoid alcoholic beverages.  Do not smoke.  If you have hot flashes, dress in layers.  Take supplements, calcium, and vitamin D to strengthen bones.  You can use over-the-counter lubricants or moisturizers for vaginal dryness.  Group therapy is sometimes very helpful.  Acupuncture may be helpful in some cases. Contact a health care provider if:  You are not sure you are in menopause.  You are having menopausal symptoms and need advice and treatment.  You are still having menstrual periods after age 55 years.  You have pain with intercourse.  Menopause is complete (no menstrual period for 12 months) and you develop vaginal bleeding.  You need a referral to a specialist (gynecologist, psychiatrist, or psychologist) for treatment. Get help right   away if:  You have severe depression.  You have excessive vaginal bleeding.  You fell and think you have a  broken bone.  You have pain when you urinate.  You develop leg or chest pain.  You have a fast pounding heart beat (palpitations).  You have severe headaches.  You develop vision problems.  You feel a lump in your breast.  You have abdominal pain or severe indigestion. This information is not intended to replace advice given to you by your health care provider. Make sure you discuss any questions you have with your health care provider. Document Released: 07/25/2003 Document Revised: 10/10/2015 Document Reviewed: 12/01/2012 Elsevier Interactive Patient Education  2017 [employer]  

## 2016-06-18 NOTE — Assessment & Plan Note (Signed)
Resolved due to no longer menstruating.

## 2016-06-18 NOTE — Progress Notes (Signed)
   Subjective:    Patient ID: Connie Bryant is a 51 y.o. female presenting with Discuss Medication/Bleeding  on 06/18/2016  HPI: Patient seen and booked for [facility] x 2 which were canceled due to needing clearance. Had cards clear. Previously had menorrhagia. She had iron deficiency anemia. No cycle cince 06/2014. Has stopped her iron replacement. Has had night sweats. Was on Megace and stopped this in 10/17. No cycle since then.  Review of Systems  Constitutional: Negative for chills and fever.  Respiratory: Negative for shortness of breath.   Cardiovascular: Negative for chest pain.  Gastrointestinal: Negative for abdominal pain, nausea and vomiting.  Genitourinary: Negative for dysuria.  Skin: Negative for rash.      Objective:    BP 102/69   Pulse (!) 56   Wt 208 lb 1.6 oz (94.4 kg)   BMI 32.59 kg/m  Physical Exam  Constitutional: She is oriented to person, place, and time. She appears well-developed and well-nourished. No distress.  HENT:  Head: Normocephalic and atraumatic.  Eyes: No scleral icterus.  Neck: Neck supple.  Cardiovascular: Normal rate.   Pulmonary/Chest: Effort normal.  Abdominal: Soft.  Neurological: She is alert and oriented to person, place, and time.  Skin: Skin is warm and dry.  Psychiatric: She has a normal mood and affect.        Assessment & Plan:   Problem List Items Addressed This Visit      Unprioritized   Anemia    Resolved due to no longer menstruating.       Other Visit Diagnoses    Amenorrhea    -  Primary   Likely menopusal--check [facility] to be sure   Relevant Orders   Follicle stimulating hormone   Need for influenza vaccination       Flu shot updated.   Relevant Orders   Flu Vaccine QUAD 36+ mos IM    Mammogram scheduled for patient.  Total face-to-face time with patient: 15 minutes. Over 50% of encounter was spent on counseling and coordination of care. Return in about 3 months (around 09/15/2016), or if symptoms worsen or  fail to improve, for repeat pap.  [name] 06/18/2016 2:13 PM

## 2016-06-19 ENCOUNTER — Telehealth: Payer: Self-pay | Admitting: *Deleted

## 2016-06-19 LAB — FOLLICLE STIMULATING HORMONE: FSH: 23.3 m[IU]/mL

## 2016-06-19 NOTE — Telephone Encounter (Signed)
Called pt and informed her of lab result indication she is likely menopausal. She should notify our office if she has further bleeding as this will need to be evaluated. Pt voiced understanding.

## 2016-07-02 ENCOUNTER — Ambulatory Visit
Admission: RE | Admit: 2016-07-02 | Discharge: 2016-07-02 | Disposition: A | Discharge status: Home / Self Care | Payer: BLUE CROSS/BLUE SHIELD | Source: Ambulatory Visit | Attending: Internal Medicine | Admitting: Internal Medicine

## 2016-07-02 DIAGNOSIS — Z1231 Encounter for screening mammogram for malignant neoplasm of breast: Secondary | ICD-10-CM | POA: Diagnosis not present

## 2016-07-02 DIAGNOSIS — Z1239 Encounter for other screening for malignant neoplasm of breast: Secondary | ICD-10-CM

## 2016-07-07 ENCOUNTER — Encounter: Payer: Self-pay | Admitting: Internal Medicine

## 2016-07-09 ENCOUNTER — Telehealth: Payer: Self-pay

## 2016-07-09 NOTE — Telephone Encounter (Signed)
Contacted pt go over MM results pt is aware of results and doesn't have any questions or concerns

## 2016-07-15 ENCOUNTER — Telehealth: Payer: Self-pay | Admitting: Internal Medicine

## 2016-07-15 NOTE — Telephone Encounter (Signed)
Patient came to the office to request lab results from office visit 04-28-16. Pt is part of a wellness program (under [employer]) and needs to provide this information. Pt needs appt by 3/10. Please follow. Contact pt when it's ready for pick up.   Thank you.

## 2016-07-15 NOTE — Telephone Encounter (Signed)
Returned pt call and informed pt that she can pick up results today

## 2016-07-28 ENCOUNTER — Ambulatory Visit: Payer: BLUE CROSS/BLUE SHIELD | Admitting: Internal Medicine

## 2016-07-29 ENCOUNTER — Ambulatory Visit: Payer: BLUE CROSS/BLUE SHIELD | Attending: Internal Medicine | Admitting: Internal Medicine

## 2016-07-29 VITALS — BP 112/72 | HR 52 | Temp 97.6°F | Wt 215.2 lb

## 2016-07-29 DIAGNOSIS — I10 Essential (primary) hypertension: Secondary | ICD-10-CM

## 2016-07-29 DIAGNOSIS — K219 Gastro-esophageal reflux disease without esophagitis: Secondary | ICD-10-CM | POA: Diagnosis not present

## 2016-07-29 DIAGNOSIS — Z23 Encounter for immunization: Secondary | ICD-10-CM | POA: Diagnosis not present

## 2016-07-29 DIAGNOSIS — D649 Anemia, unspecified: Secondary | ICD-10-CM | POA: Insufficient documentation

## 2016-07-29 DIAGNOSIS — H409 Unspecified glaucoma: Secondary | ICD-10-CM | POA: Insufficient documentation

## 2016-07-29 DIAGNOSIS — E669 Obesity, unspecified: Secondary | ICD-10-CM | POA: Diagnosis not present

## 2016-07-29 DIAGNOSIS — Z6833 Body mass index (BMI) 33.0-33.9, adult: Secondary | ICD-10-CM | POA: Insufficient documentation

## 2016-07-29 DIAGNOSIS — Z1321 Encounter for screening for nutritional disorder: Secondary | ICD-10-CM | POA: Diagnosis not present

## 2016-07-29 MED ORDER — FAMOTIDINE 20 MG PO TABS: 20.0000 mg | ORAL_TABLET | Freq: Two times a day (BID) | ORAL | 2 refills | Status: DC

## 2016-07-29 MED ORDER — LISINOPRIL-HYDROCHLOROTHIAZIDE 20-25 MG PO TABS: 1.0000 | ORAL_TABLET | Freq: Every day | ORAL | 3 refills | Status: DC

## 2016-07-29 MED ORDER — METOPROLOL TARTRATE 25 MG PO TABS: 12.5000 mg | ORAL_TABLET | Freq: Two times a day (BID) | ORAL | 3 refills | Status: DC

## 2016-07-29 NOTE — Progress Notes (Signed)
Connie Bryant, is a 51 y.o. female  [id number]  [id number]  DOB - 07/15/1965  Chief Complaint  Patient presents with  . Hypertension        Subjective:   Connie Bryant is a 51 y.o. female here today for a follow up visit for htn, last seen 05/13/16. Since than she has been seen by OB 06/18/16, and felt she was menopausal by labs. However, pt states she started spotting yesterday. Overall, doing well and taking all her meds. She notes low salt diet. Has not had colonoscopy screening yet -she told gi she would call back, working on getting a ride.   Patient has No headache, No chest pain, No abdominal pain - No Nausea, No new weakness tingling or numbness, No Cough - SOB.  No problems updated.  ALLERGIES: Allergies  Allergen Reactions  . Oxycodone-Acetaminophen Hives  . Tylox [Oxycodone-Acetaminophen] Hives    PAST MEDICAL HISTORY: Past Medical History:  Diagnosis Date  . Anemia   . Glaucoma   . Hypertension   . Obesity   . Pneumonia    2008    MEDICATIONS AT HOME: Prior to Admission medications   Medication Sig Start Date End Date Taking? Authorizing Provider  famotidine (PEPCID) 20 MG tablet Take 1 tablet (20 mg total) by mouth 2 (two) times daily. Must have office visit for more refills 07/29/16  Yes Maren Reamer, MD  hydrocortisone ointment 0.5 % Apply 1 application topically 2 (two) times daily. 07/24/13  Yes Reyne Dumas, MD  lisinopril-hydrochlorothiazide (PRINZIDE,ZESTORETIC) 20-25 MG tablet Take 1 tablet by mouth daily. 07/29/16  Yes Maren Reamer, MD  metoprolol tartrate (LOPRESSOR) 25 MG tablet Take 0.5 tablets (12.5 mg total) by mouth 2 (two) times daily. 07/29/16  Yes Maren Reamer, MD  traMADol (ULTRAM) 50 MG tablet Take 1 tablet (50 mg total) by mouth every 6 (six) hours as needed for moderate pain or severe pain. 02/11/15  Yes Clayton Bibles, PA-C  travoprost, benzalkonium, (TRAVATAN) 0.004 % ophthalmic solution Place 1 drop into both eyes at  bedtime.   Yes Historical Provider, MD  ibuprofen (ADVIL,MOTRIN) 600 MG tablet Take 1 tablet (600 mg total) by mouth every 6 (six) hours as needed for mild pain or moderate pain. Patient not taking: Reported on 06/18/2016 02/11/15   Clayton Bibles, PA-C     Objective:   Vitals:   07/29/16 1121  BP: 112/72  Pulse: (!) 52  Temp: 97.6 F (36.4 C)  TempSrc: Oral  SpO2: 100%  Weight: 215 lb 3.2 oz (97.6 kg)    Exam General appearance : Awake, alert, not in any distress. Speech Clear. Not toxic looking, morbid obese, pleasant. HEENT: Atraumatic and Normocephalic, pupils equally reactive to light. Neck: supple, no JVD.  Chest:Good air entry bilaterally, no added sounds. CVS: S1 S2 regular, no murmurs/gallups or rubs. Abdomen: Bowel sounds active, obese, Non tender and not distended with no gaurding, rigidity or rebound. Extremities: B/L Lower Ext shows no edema, both legs are warm to touch Neurology: Awake alert, and oriented X 3, CN II-XII grossly intact, Non focal Skin:No Rash  Data Review Lab Results  Component Value Date   HGBA1C 5.5 04/28/2016   HGBA1C 5.2 04/25/2013    Depression screen [facility] 2/9 06/18/2016 04/28/2016  Decreased Interest 2 0  Down, Depressed, Hopeless 2 2  PHQ - 2 Score 4 2  Altered sleeping 2 1  Tired, decreased energy 2 2  Change in appetite 1 1  Feeling bad or failure about  yourself  0 0  Trouble concentrating 0 0  Moving slowly or fidgety/restless 0 0  Suicidal thoughts 0 0  PHQ-9 Score 9 6      Assessment & Plan   1. HTN (hypertension), benign - controlled, but slightly brady, asx., continue low salt diet. - will keep prinzide 20-25qd - reduce metoprolol to 12.5bid,  - f/u w/ RN Carilyn Goodpasture in 2 wks for bp check. Please check hr as well. Goal sbp 100-130.  If sbp >131, and hr >75, than increase metoprolol tartrate back to 25bid. thanks  2. Gastroesophageal reflux disease, esophagitis presence not specified Controlled, gerd diet discussed, prn pepcid  working.  3. Encounter for vitamin deficiency screening, now w/ estrogen def - recd calcium 1200mg  /daily for bone health  - VITAMIN D 25 Hydroxy (Vit-D Deficiency, Fractures) - may need rx vs otc.  4. Suspect pt perimenopausal now given had spotting again yesterday. Follow.  5. Encouraged colon cancer screening w/ colonoscopy   needs to call gi to set up appt.   Patient have been counseled extensively about nutrition and exercise  Return in about 3 months (around 10/29/2016), or if symptoms worsen or fail to improve.  The patient was given clear instructions to go to ER or return to medical center if symptoms don't improve, worsen or new problems develop. The patient verbalized understanding. The patient was told to call to get lab results if they haven't heard anything in the next week.   This note has been created with Surveyor, quantity. Any transcriptional errors are unintentional.   Maren Reamer, MD, [place] [facility] [place], [place]   07/29/2016, 12:07 PM

## 2016-07-29 NOTE — Patient Instructions (Addendum)
Connie Munson RN - bp check 2 wks.  -  Calcium 1200mg / daily - for bone health - vit d  - checking today  Low-Sodium Eating Plan Sodium, which is an element that makes up salt, helps you maintain a healthy balance of fluids in your body. Too much sodium can increase your blood pressure and cause fluid and waste to be held in your body. Your health care provider or dietitian may recommend following this plan if you have high blood pressure (hypertension), kidney disease, liver disease, or heart failure. Eating less sodium can help lower your blood pressure, reduce swelling, and protect your heart, liver, and kidneys. What are tips for following this plan? General guidelines   Most people on this plan should limit their sodium intake to 1,500-2,000 mg (milligrams) of sodium each day. Reading food labels   The Nutrition Facts label lists the amount of sodium in one serving of the food. If you eat more than one serving, you must multiply the listed amount of sodium by the number of servings.  Choose foods with less than 140 mg of sodium per serving.  Avoid foods with 300 mg of sodium or more per serving. Shopping   Look for lower-sodium products, often labeled as "low-sodium" or "no salt added."  Always check the sodium content even if foods are labeled as "unsalted" or "no salt added".  Buy fresh foods.  Avoid canned foods and premade or frozen meals.  Avoid canned, cured, or processed meats  Buy breads that have less than 80 mg of sodium per slice. Cooking   Eat more home-cooked food and less restaurant, buffet, and fast food.  Avoid adding salt when cooking. Use salt-free seasonings or herbs instead of table salt or sea salt. Check with your health care provider or pharmacist before using salt substitutes.  Cook with plant-based oils, such as canola, sunflower, or olive oil. Meal planning   When eating at a restaurant, ask that your food be prepared with less salt or no salt,  if possible.  Avoid foods that contain MSG (monosodium glutamate). MSG is sometimes added to [place] food, bouillon, and some canned foods. What foods are recommended? The items listed may not be a complete list. Talk with your dietitian about what dietary choices are best for you. Grains  Low-sodium cereals, including oats, puffed wheat and rice, and shredded wheat. Low-sodium crackers. Unsalted rice. Unsalted pasta. Low-sodium bread. Whole-grain breads and whole-grain pasta. Vegetables  Fresh or frozen vegetables. "No salt added" canned vegetables. "No salt added" tomato sauce and paste. Low-sodium or reduced-sodium tomato and vegetable juice. Fruits  Fresh, frozen, or canned fruit. Fruit juice. Meats and other protein foods  Fresh or frozen (no salt added) meat, poultry, seafood, and fish. Low-sodium canned tuna and salmon. Unsalted nuts. Dried peas, beans, and lentils without added salt. Unsalted canned beans. Eggs. Unsalted nut butters. Dairy  Milk. Soy milk. Cheese that is naturally low in sodium, such as ricotta cheese, fresh mozzarella, or Swiss cheese Low-sodium or reduced-sodium cheese. Cream cheese. Yogurt. Fats and oils  Unsalted butter. Unsalted margarine with no trans fat. Vegetable oils such as canola or olive oils. Seasonings and other foods  Fresh and dried herbs and spices. Salt-free seasonings. Low-sodium mustard and ketchup. Sodium-free salad dressing. Sodium-free light mayonnaise. Fresh or refrigerated horseradish. Lemon juice. Vinegar. Homemade, reduced-sodium, or low-sodium soups. Unsalted popcorn and pretzels. Low-salt or salt-free chips. What foods are not recommended? The items listed may not be a complete list. Talk  with your dietitian about what dietary choices are best for you. Grains  Instant hot cereals. Bread stuffing, pancake, and biscuit mixes. Croutons. Seasoned rice or pasta mixes. Noodle soup cups. Boxed or frozen macaroni and cheese. Regular salted  crackers. Self-rising flour. Vegetables  Sauerkraut, pickled vegetables, and relishes. Olives. [place] fries. Onion rings. Regular canned vegetables (not low-sodium or reduced-sodium). Regular canned tomato sauce and paste (not low-sodium or reduced-sodium). Regular tomato and vegetable juice (not low-sodium or reduced-sodium). Frozen vegetables in sauces. Meats and other protein foods  Meat or fish that is salted, canned, smoked, spiced, or pickled. Bacon, ham, sausage, hotdogs, corned beef, chipped beef, packaged lunch meats, salt pork, jerky, pickled herring, anchovies, regular canned tuna, sardines, salted nuts. Dairy  Processed cheese and cheese spreads. Cheese curds. Blue cheese. Feta cheese. String cheese. Regular cottage cheese. Buttermilk. Canned milk. Fats and oils  Salted butter. Regular margarine. Ghee. Bacon fat. Seasonings and other foods  Onion salt, garlic salt, seasoned salt, table salt, and sea salt. Canned and packaged gravies. Worcestershire sauce. Tartar sauce. Barbecue sauce. Teriyaki sauce. Soy sauce, including reduced-sodium. Steak sauce. Fish sauce. Oyster sauce. Cocktail sauce. Horseradish that you find on the shelf. Regular ketchup and mustard. Meat flavorings and tenderizers. Bouillon cubes. Hot sauce and Tabasco sauce. Premade or packaged marinades. Premade or packaged taco seasonings. Relishes. Regular salad dressings. Salsa. Potato and tortilla chips. Corn chips and puffs. Salted popcorn and pretzels. Canned or dried soups. Pizza. Frozen entrees and pot pies. Summary  Eating less sodium can help lower your blood pressure, reduce swelling, and protect your heart, liver, and kidneys.  Most people on this plan should limit their sodium intake to 1,500-2,000 mg (milligrams) of sodium each day.  Canned, boxed, and frozen foods are high in sodium. Restaurant foods, fast foods, and pizza are also very high in sodium. You also get sodium by adding salt to food.  Try to cook  at home, eat more fresh fruits and vegetables, and eat less fast food, canned, processed, or prepared foods. This information is not intended to replace advice given to you by your health care provider. Make sure you discuss any questions you have with your health care provider. Document Released: 10/24/2001 Document Revised: 04/27/2016 Document Reviewed: 04/27/2016 Elsevier Interactive Patient Education  2017 [employer] for Gastroesophageal Reflux Disease, Adult When you have gastroesophageal reflux disease (GERD), the foods you eat and your eating habits are very important. Choosing the right foods can help ease your discomfort. What guidelines do I need to follow?  Choose fruits, vegetables, whole grains, and low-fat dairy products.  Choose low-fat meat, fish, and poultry.  Limit fats such as oils, salad dressings, butter, nuts, and avocado.  Keep a food diary. This helps you identify foods that cause symptoms.  Avoid foods that cause symptoms. These may be different for everyone.  Eat small meals often instead of 3 large meals a day.  Eat your meals slowly, in a place where you are relaxed.  Limit fried foods.  Cook foods using methods other than frying.  Avoid drinking alcohol.  Avoid drinking large amounts of liquids with your meals.  Avoid bending over or lying down until 2-3 hours after eating. What foods are not recommended? These are some foods and drinks that may make your symptoms worse: Vegetables  Tomatoes. Tomato juice. Tomato and spaghetti sauce. Chili peppers. Onion and garlic. Horseradish. Fruits  Oranges, grapefruit, and lemon (fruit and juice). Meats  High-fat meats,  fish, and poultry. This includes hot dogs, ribs, ham, sausage, salami, and bacon. Dairy  Whole milk and chocolate milk. Sour cream. Cream. Butter. Ice cream. Cream cheese. Drinks  Coffee and tea. Bubbly (carbonated) drinks or energy drinks. Condiments  Hot sauce.  Barbecue sauce. Sweets/Desserts  Chocolate and cocoa. Donuts. Peppermint and spearmint. Fats and Oils  High-fat foods. This includes [place] fries and potato chips. Other  Vinegar. Strong spices. This includes black pepper, white pepper, red pepper, cayenne, curry powder, cloves, ginger, and chili powder. The items listed above may not be a complete list of foods and drinks to avoid. Contact your dietitian for more information.  This information is not intended to replace advice given to you by your health care provider. Make sure you discuss any questions you have with your health care provider. Document Released: 11/03/2011 Document Revised: 10/10/2015 Document Reviewed: 03/08/2013 Elsevier Interactive Patient Education  2017 [employer].  Tdap Vaccine (Tetanus, Diphtheria and Pertussis): What You Need to Know 1. Why get vaccinated? Tetanus, diphtheria and pertussis are very serious diseases. Tdap vaccine can protect [place] from these diseases. And, Tdap vaccine given to pregnant women can protect newborn babies against pertussis. TETANUS (Lockjaw) is rare in the [place] today. It causes painful muscle tightening and stiffness, usually all over the body.  It can lead to tightening of muscles in the head and neck so you can't open your mouth, swallow, or sometimes even breathe. Tetanus kills about 1 out of 10 people who are infected even after receiving the best medical care. DIPHTHERIA is also rare in the [place] today. It can cause a thick coating to form in the back of the throat.  It can lead to breathing problems, heart failure, paralysis, and death. PERTUSSIS (Whooping Cough) causes severe coughing spells, which can cause difficulty breathing, vomiting and disturbed sleep.  It can also lead to weight loss, incontinence, and rib fractures. Up to 2 in 100 adolescents and 5 in 100 adults with pertussis are hospitalized or have complications, which could include pneumonia or  death. These diseases are caused by bacteria. Diphtheria and pertussis are spread from person to person through secretions from coughing or sneezing. Tetanus enters the body through cuts, scratches, or wounds. Before vaccines, as many as 200,000 cases of diphtheria, 200,000 cases of pertussis, and hundreds of cases of tetanus, were reported in the [place] each year. Since vaccination began, reports of cases for tetanus and diphtheria have dropped by about 99% and for pertussis by about 80%. 2. Tdap vaccine Tdap vaccine can protect adolescents and adults from tetanus, diphtheria, and pertussis. One dose of Tdap is routinely given at age 43 or 23. People who did not get Tdap at that age should get it as soon as possible. Tdap is especially important for healthcare professionals and anyone having close contact with a baby younger than 12 months. Pregnant women should get a dose of Tdap during every pregnancy, to protect the newborn from pertussis. Infants are most at risk for severe, life-threatening complications from pertussis. Another vaccine, called Td, protects against tetanus and diphtheria, but not pertussis. A Td booster should be given every 10 years. Tdap may be given as one of these boosters if you have never gotten Tdap before. Tdap may also be given after a severe cut or burn to prevent tetanus infection. Your doctor or the person giving you the vaccine can give you more information. Tdap may safely be given at the same time as other vaccines.  3. Some people should not get this vaccine  A person who has ever had a life-threatening allergic reaction after a previous dose of any diphtheria, tetanus or pertussis containing vaccine, OR has a severe allergy to any part of this vaccine, should not get Tdap vaccine. Tell the person giving the vaccine about any severe allergies.  Anyone who had coma or long repeated seizures within 7 days after a childhood dose of DTP or DTaP, or a previous  dose of Tdap, should not get Tdap, unless a cause other than the vaccine was found. They can still get Td.  Talk to your doctor if you:  have seizures or another nervous system problem,  had severe pain or swelling after any vaccine containing diphtheria, tetanus or pertussis,  ever had a condition called Guillain-Barr Syndrome (GBS),  aren't feeling well on the day the shot is scheduled. 4. Risks With any medicine, including vaccines, there is a chance of side effects. These are usually mild and go away on their own. Serious reactions are also possible but are rare. Most people who get Tdap vaccine do not have any problems with it. Mild problems following Tdap:  (Did not interfere with activities)  Pain where the shot was given (about 3 in 4 adolescents or 2 in 3 adults)  Redness or swelling where the shot was given (about 1 person in 5)  Mild fever of at least 100.64F (up to about 1 in 25 adolescents or 1 in 100 adults)  Headache (about 3 or 4 people in 10)  Tiredness (about 1 person in 3 or 4)  Nausea, vomiting, diarrhea, stomach ache (up to 1 in 4 adolescents or 1 in 10 adults)  Chills, sore joints (about 1 person in 10)  Body aches (about 1 person in 3 or 4)  Rash, swollen glands (uncommon) Moderate problems following Tdap:  (Interfered with activities, but did not require medical attention)  Pain where the shot was given (up to 1 in 5 or 6)  Redness or swelling where the shot was given (up to about 1 in 16 adolescents or 1 in 12 adults)  Fever over 102F (about 1 in 100 adolescents or 1 in 250 adults)  Headache (about 1 in 7 adolescents or 1 in 10 adults)  Nausea, vomiting, diarrhea, stomach ache (up to 1 or 3 people in 100)  Swelling of the entire arm where the shot was given (up to about 1 in 500). Severe problems following Tdap:  (Unable to perform usual activities; required medical attention)  Swelling, severe pain, bleeding and redness in the arm where  the shot was given (rare). Problems that could happen after any vaccine:   People sometimes faint after a medical procedure, including vaccination. Sitting or lying down for about 15 minutes can help prevent fainting, and injuries caused by a fall. Tell your doctor if you feel dizzy, or have vision changes or ringing in the ears.  Some people get severe pain in the shoulder and have difficulty moving the arm where a shot was given. This happens very rarely.  Any medication can cause a severe allergic reaction. Such reactions from a vaccine are very rare, estimated at fewer than 1 in a million doses, and would happen within a few minutes to a few hours after the vaccination. As with any medicine, there is a very remote chance of a vaccine causing a serious injury or death. The safety of vaccines is always being monitored. For more information, visit: http://www.aguilar.org/ 5.  What if there is a serious problem? What should I look for?  Look for anything that concerns you, such as signs of a severe allergic reaction, very high fever, or unusual behavior. Signs of a severe allergic reaction can include hives, swelling of the face and throat, difficulty breathing, a fast heartbeat, dizziness, and weakness. These would usually start a few minutes to a few hours after the vaccination. What should I do?   If you think it is a severe allergic reaction or other emergency that can't wait, call 9-1-1 or get the person to the nearest hospital. Otherwise, call your doctor.  Afterward, the reaction should be reported to the Vaccine Adverse Event Reporting System (VAERS). Your doctor might file this report, or you can do it yourself through the VAERS web site at www.vaers.SamedayNews.es, or by calling 713-458-0913.  VAERS does not give medical advice. 6. The National Vaccine Injury Compensation Program The [employer] Vaccine Injury Compensation Program (VICP) is a federal program that was created to compensate  people who may have been injured by certain vaccines. Persons who believe they may have been injured by a vaccine can learn about the program and about filing a claim by calling 331-772-2558 or visiting the [place] website at GoldCloset.com.ee. There is a time limit to file a claim for compensation. 7. How can I learn more?  Ask your doctor. He or she can give you the vaccine package insert or suggest other sources of information.  Call your local or state health department.  Contact the Centers for Disease Control and Prevention (CDC):  Call (712)803-0517 (1-800-CDC-INFO) or  Visit CDC's website at http://hunter.com/ CDC Tdap Vaccine VIS (07/11/13) This information is not intended to replace advice given to you by your health care provider. Make sure you discuss any questions you have with your health care provider. Document Released: 11/03/2011 Document Revised: 01/23/2016 Document Reviewed: 01/23/2016 Elsevier Interactive Patient Education  2017 [employer].

## 2016-07-30 LAB — VITAMIN D 25 HYDROXY (VIT D DEFICIENCY, FRACTURES): Vit D, 25-Hydroxy: 9 ng/mL — ABNORMAL LOW (ref 30–100)

## 2016-07-31 ENCOUNTER — Telehealth: Payer: Self-pay

## 2016-07-31 NOTE — Telephone Encounter (Signed)
Call patient she reports having her period again. She stated it has been heavy at times. According to patient chart patient should follow up with our office in three months. I have advised patient to make an appointment to seen with our providers. Patient voice understanding.

## 2016-08-03 ENCOUNTER — Other Ambulatory Visit: Payer: Self-pay | Admitting: Internal Medicine

## 2016-08-03 MED ORDER — VITAMIN D (ERGOCALCIFEROL) 1.25 MG (50000 UNIT) PO CAPS: 50000.0000 [IU] | ORAL_CAPSULE | ORAL | 0 refills | Status: DC

## 2016-08-13 ENCOUNTER — Telehealth: Payer: Self-pay

## 2016-08-13 NOTE — Telephone Encounter (Signed)
Contacted pt to go over lab results pt is aware of results and doesn't have any questions or concerns 

## 2016-08-17 ENCOUNTER — Ambulatory Visit: Payer: BLUE CROSS/BLUE SHIELD | Attending: Internal Medicine | Admitting: *Deleted

## 2016-08-17 VITALS — BP 116/66 | HR 69 | Resp 16

## 2016-08-17 DIAGNOSIS — I10 Essential (primary) hypertension: Secondary | ICD-10-CM | POA: Diagnosis not present

## 2016-08-17 NOTE — Progress Notes (Signed)
Pt here for f/u BP check. Pt denies chest pain, SOB, HA, new vison concerns, or generalized swelling. Pt states she feels fine.  Has been taking medications as prescribed.  Blood pressure taken manually while patient is sitting. BP reading is 116/66 and HR: 69.    Nurse visit will be routed to provider.Carilyn Goodpasture, RN, BSN

## 2016-10-01 ENCOUNTER — Encounter: Payer: Self-pay | Admitting: Internal Medicine

## 2016-10-02 ENCOUNTER — Encounter: Payer: Self-pay | Admitting: Internal Medicine

## 2016-10-05 ENCOUNTER — Encounter: Payer: Self-pay | Admitting: Internal Medicine

## 2017-01-02 DIAGNOSIS — H40033 Anatomical narrow angle, bilateral: Secondary | ICD-10-CM | POA: Diagnosis not present

## 2017-01-02 DIAGNOSIS — H04123 Dry eye syndrome of bilateral lacrimal glands: Secondary | ICD-10-CM | POA: Diagnosis not present

## 2017-03-17 ENCOUNTER — Ambulatory Visit: Payer: BLUE CROSS/BLUE SHIELD | Attending: Family Medicine | Admitting: Family Medicine

## 2017-03-17 ENCOUNTER — Encounter: Payer: Self-pay | Admitting: Family Medicine

## 2017-03-17 VITALS — BP 113/75 | HR 69 | Temp 98.5°F | Resp 18 | Ht 67.0 in | Wt 221.4 lb

## 2017-03-17 DIAGNOSIS — Z79899 Other long term (current) drug therapy: Secondary | ICD-10-CM | POA: Insufficient documentation

## 2017-03-17 DIAGNOSIS — R0683 Snoring: Secondary | ICD-10-CM | POA: Diagnosis not present

## 2017-03-17 DIAGNOSIS — Z23 Encounter for immunization: Secondary | ICD-10-CM

## 2017-03-17 DIAGNOSIS — I10 Essential (primary) hypertension: Secondary | ICD-10-CM

## 2017-03-17 DIAGNOSIS — J309 Allergic rhinitis, unspecified: Secondary | ICD-10-CM | POA: Diagnosis not present

## 2017-03-17 MED ORDER — AMLODIPINE BESYLATE 10 MG PO TABS: 10.0000 mg | ORAL_TABLET | Freq: Every day | ORAL | 2 refills | Status: DC

## 2017-03-17 MED ORDER — FLUTICASONE PROPIONATE 50 MCG/ACT NA SUSP: 2.0000 | Freq: Every day | NASAL | 6 refills | Status: DC

## 2017-03-17 MED ORDER — HYDROCHLOROTHIAZIDE 25 MG PO TABS
25.0000 mg | ORAL_TABLET | Freq: Every day | ORAL | 2 refills | Status: DC
Start: 2017-03-17 — End: 2017-04-07

## 2017-03-17 NOTE — Progress Notes (Signed)
Subjective:  Patient ID: Connie Bryant, female    DOB: 12/09/1965  Age: 51 y.o. MRN: [id number]  CC: Hypertension   HPI Connie Bryant presents for hypertension and medication refills.  Hypertension: She reports adherence with low-salt diet.  She does not check BP at home.  She denies any cardiac symptoms.  Patient denies any chest pain, dyspnea, bilateral lower extremity edema, syncope or claudication symptoms.  She complains of loud breathing.  She reports symptoms occur with rest and with activity.  She denies any dyspnea.  She does report history of chronic allergic rhinitis but denies any history of nasal polyps or congestion.  She does report loud snoring at night for several years.       Outpatient Medications Prior to Visit  Medication Sig Dispense Refill  . lisinopril-hydrochlorothiazide (PRINZIDE,ZESTORETIC) 20-25 MG tablet Take 1 tablet by mouth daily. 90 tablet 3  . famotidine (PEPCID) 20 MG tablet Take 1 tablet (20 mg total) by mouth 2 (two) times daily. Must have office visit for more refills 30 tablet 2  . travoprost, benzalkonium, (TRAVATAN) 0.004 % ophthalmic solution Place 1 drop into both eyes at bedtime.    . Vitamin D, Ergocalciferol, (DRISDOL) 50000 units CAPS capsule Take 1 capsule (50,000 Units total) by mouth every 7 (seven) days. 12 capsule 0  . hydrocortisone ointment 0.5 % Apply 1 application topically 2 (two) times daily. 30 g 2  . ibuprofen (ADVIL,MOTRIN) 600 MG tablet Take 1 tablet (600 mg total) by mouth every 6 (six) hours as needed for mild pain or moderate pain. (Patient not taking: Reported on 06/18/2016) 15 tablet 0  . metoprolol tartrate (LOPRESSOR) 25 MG tablet Take 0.5 tablets (12.5 mg total) by mouth 2 (two) times daily. 45 tablet 3  . traMADol (ULTRAM) 50 MG tablet Take 1 tablet (50 mg total) by mouth every 6 (six) hours as needed for moderate pain or severe pain. (Patient not taking: Reported on 08/17/2016) 15 tablet 0   No  facility-administered medications prior to visit.     ROS Review of Systems  Constitutional: Negative.   HENT:       Snoring  Respiratory: Negative.   Cardiovascular: Negative.   Gastrointestinal: Negative.      Objective:  BP 113/75 (BP Location: Left Arm, Patient Position: Sitting, Cuff Size: Normal)   Pulse 69   Temp 98.5 F (36.9 C) (Oral)   Resp 18   Ht 5\' 7"  (1.702 m)   Wt 221 lb 6.4 oz (100.4 kg)   SpO2 100%   BMI 34.68 kg/m   BP/Weight 03/17/2017 08/17/2016 0/63/0160  Systolic BP 109 323 557  Diastolic BP 75 66 72  Wt. (Lbs) 221.4 - 215.2  BMI 34.68 - 33.71     Physical Exam  Constitutional: She appears well-developed and well-nourished.  HENT:  Head: Normocephalic and atraumatic.  Right Ear: External ear normal.  Left Ear: External ear normal.  Nose: Nose normal.  Mouth/Throat: Oropharynx is clear and moist.  Eyes: Conjunctivae are normal. Pupils are equal, round, and reactive to light.  Neck: No JVD present.  Cardiovascular: Normal rate, regular rhythm, normal heart sounds and intact distal pulses.  Pulmonary/Chest: Effort normal and breath sounds normal.  Abdominal: Soft. Bowel sounds are normal. There is no tenderness.  Skin: Skin is warm and dry.  Nursing note and vitals reviewed.   Assessment & Plan:   1. Essential hypertension  - amLODipine (NORVASC) 10 MG tablet; Take 1 tablet (10 mg total) by mouth  daily.  Dispense: 30 tablet; Refill: 2 - hydrochlorothiazide (HYDRODIURIL) 25 MG tablet; Take 1 tablet (25 mg total) by mouth daily.  Dispense: 30 tablet; Refill: 2 - CMP and Liver - Lipid Panel  2. Snoring  - Nocturnal polysomnography (NPSG); Future  3. Chronic allergic rhinitis  - fluticasone (FLONASE) 50 MCG/ACT nasal spray; Place 2 sprays into both nostrils daily.  Dispense: 16 g; Refill: 6  4. Needs flu shot  - Flu Vaccine QUAD 36+ mos IM    Follow-up: Return in about 2 weeks (around 03/31/2017) for BP with Travia .   Alfonse Spruce FNP

## 2017-03-17 NOTE — Patient Instructions (Signed)

## 2017-03-18 LAB — CMP AND LIVER
ALBUMIN: 4.2 g/dL (ref 3.5–5.5)
ALK PHOS: 82 IU/L (ref 39–117)
ALT: 7 IU/L (ref 0–32)
AST: 15 IU/L (ref 0–40)
BILIRUBIN TOTAL: 0.2 mg/dL (ref 0.0–1.2)
BUN: 13 mg/dL (ref 6–24)
Bilirubin, Direct: 0.07 mg/dL (ref 0.00–0.40)
CO2: 24 mmol/L (ref 20–29)
CREATININE: 2.44 mg/dL — AB (ref 0.57–1.00)
Calcium: 9.6 mg/dL (ref 8.7–10.2)
Chloride: 92 mmol/L — ABNORMAL LOW (ref 96–106)
GFR calc Af Amer: 25 mL/min/{1.73_m2} — ABNORMAL LOW (ref >59)
GFR calc non Af Amer: 22 mL/min/{1.73_m2} — ABNORMAL LOW (ref >59)
Glucose: 80 mg/dL (ref 65–99)
POTASSIUM: 3.8 mmol/L (ref 3.5–5.2)
SODIUM: 131 mmol/L — AB (ref 134–144)
Total Protein: 8.1 g/dL (ref 6.0–8.5)

## 2017-03-18 LAB — LIPID PANEL
CHOLESTEROL TOTAL: 191 mg/dL (ref 100–199)
Chol/HDL Ratio: 4.2 ratio (ref 0.0–4.4)
HDL: 45 mg/dL (ref >39)
LDL CALC: 131 mg/dL — AB (ref 0–99)
Triglycerides: 76 mg/dL (ref 0–149)
VLDL Cholesterol Cal: 15 mg/dL (ref 5–40)

## 2017-03-31 ENCOUNTER — Ambulatory Visit: Payer: BLUE CROSS/BLUE SHIELD | Attending: Family Medicine | Admitting: *Deleted

## 2017-03-31 ENCOUNTER — Other Ambulatory Visit: Payer: Self-pay

## 2017-03-31 VITALS — BP 130/80 | HR 77 | Resp 16

## 2017-03-31 DIAGNOSIS — I10 Essential (primary) hypertension: Secondary | ICD-10-CM

## 2017-03-31 DIAGNOSIS — Z7689 Persons encountering health services in other specified circumstances: Secondary | ICD-10-CM | POA: Insufficient documentation

## 2017-03-31 NOTE — Progress Notes (Signed)
Pt arrived to [facility], alert and oriented. BP at last OV on 03/17/17 with PCP was 113/75.   Pt denies chest pain, SOB, HA, dizziness, or blurred vision.  Verified medication. Pt states medication was taken this morning.  Manual blood pressure reading: 130/80 and 128/78

## 2017-04-07 ENCOUNTER — Other Ambulatory Visit: Payer: Self-pay | Admitting: Family Medicine

## 2017-04-07 DIAGNOSIS — E782 Mixed hyperlipidemia: Secondary | ICD-10-CM

## 2017-04-07 DIAGNOSIS — N184 Chronic kidney disease, stage 4 (severe): Secondary | ICD-10-CM

## 2017-04-07 DIAGNOSIS — I10 Essential (primary) hypertension: Secondary | ICD-10-CM

## 2017-04-07 MED ORDER — CLONIDINE HCL 0.1 MG PO TABS
0.1000 mg | ORAL_TABLET | Freq: Two times a day (BID) | ORAL | 2 refills | Status: DC
Start: 2017-04-07 — End: 2017-06-11

## 2017-04-07 MED ORDER — ATORVASTATIN CALCIUM 20 MG PO TABS
20.0000 mg | ORAL_TABLET | Freq: Every day | ORAL | 2 refills | Status: DC
Start: 2017-04-07 — End: 2017-06-11

## 2017-04-07 MED FILL — ?ATORVASTATIN 20 MG TABLET: 20 | 30 days supply | Qty: 30 | Fill #0 | Status: TO

## 2017-04-07 MED FILL — ?CLONIDINE HCL 0.1 MG TABL: 0.1 | 30 days supply | Qty: 60 | Fill #0 | Status: TO

## 2017-04-13 ENCOUNTER — Telehealth: Payer: Self-pay

## 2017-04-13 NOTE — Telephone Encounter (Signed)
CMA call regarding lab results & medication advice   Patient was aware and understood

## 2017-04-13 NOTE — Telephone Encounter (Signed)
-----   Message from Alfonse Spruce, [place] sent at 04/07/2017  1:47 PM EST ----- -Kidney function is decreased. Levels indicate you have chronic kidney disease. You will be referred to a nephrologist. -Stop taking your hydrochlorothiazide. You will be prescribed clonidine.  -Continue to take your amlodipine, avoid taking NSAID medications, reduce salt intake to 2 to 4 grams/day, do not smoke. Lipid levels were elevated. This can increase your risk of heart disease overtime. You will be prescribed atorvastatin. Start eating a diet low in saturated fat. Limit your intake of fried foods, red meats, and whole milk. Increase activity. Recommend scheduling f/u in 1 week to reassess BP.

## 2017-04-22 ENCOUNTER — Encounter: Payer: Self-pay | Admitting: Family Medicine

## 2017-04-22 ENCOUNTER — Ambulatory Visit: Payer: BLUE CROSS/BLUE SHIELD | Attending: Family Medicine | Admitting: Family Medicine

## 2017-04-22 VITALS — BP 115/81 | HR 76 | Temp 98.3°F | Resp 18 | Ht 67.0 in | Wt 225.6 lb

## 2017-04-22 DIAGNOSIS — R944 Abnormal results of kidney function studies: Secondary | ICD-10-CM

## 2017-04-22 DIAGNOSIS — Z79899 Other long term (current) drug therapy: Secondary | ICD-10-CM | POA: Insufficient documentation

## 2017-04-22 DIAGNOSIS — I10 Essential (primary) hypertension: Secondary | ICD-10-CM | POA: Diagnosis not present

## 2017-04-22 NOTE — Progress Notes (Signed)
Patient is here for 1 week f/up

## 2017-04-22 NOTE — Patient Instructions (Signed)

## 2017-04-23 LAB — BASIC METABOLIC PANEL
BUN/Creatinine Ratio: 12 (ref 9–23)
BUN: 10 mg/dL (ref 6–24)
CO2: 24 mmol/L (ref 20–29)
Calcium: 9.2 mg/dL (ref 8.7–10.2)
Chloride: 104 mmol/L (ref 96–106)
Creatinine, Ser: 0.82 mg/dL (ref 0.57–1.00)
GFR calc Af Amer: 95 mL/min/{1.73_m2} (ref >59)
GFR calc non Af Amer: 83 mL/min/{1.73_m2} (ref >59)
GLUCOSE: 75 mg/dL (ref 65–99)
POTASSIUM: 4.2 mmol/L (ref 3.5–5.2)
SODIUM: 140 mmol/L (ref 134–144)

## 2017-04-25 NOTE — Progress Notes (Signed)
   Subjective:  Patient ID: Connie Bryant, female    DOB: Jan 23, 1966  Age: 51 y.o. MRN: [id number]  CC: Follow-up   HPI Connie Bryant presents for hypertension follow up.  Hypertension: She reports adherence with low-salt diet.  She does not check BP at home.  She denies any cardiac symptoms.  Patient denies any chest pain, dyspnea, bilateral lower extremity edema, syncope or claudication symptoms.  Kidney function was found to be elevated with most recent  labs.  Creatinine 2.44, GFR 25, and BUN 13.  She does report family history of hypertension in kidney disease.  She reports her mother died several years ago after receiving dialysis for many years.  She appears to be tearful during visit.  She reports concerns over elevated renal labs.  She states "I seen with my mother went through, and I do not want to go through that".  She declined speaking with the LCSW or receiving any resources at this time.     Outpatient Medications Prior to Visit  Medication Sig Dispense Refill  . amLODipine (NORVASC) 10 MG tablet Take 1 tablet (10 mg total) by mouth daily. 30 tablet 2  . atorvastatin (LIPITOR) 20 MG tablet Take 1 tablet (20 mg total) by mouth daily. 30 tablet 2  . cloNIDine (CATAPRES) 0.1 MG tablet Take 1 tablet (0.1 mg total) by mouth 2 (two) times daily. 60 tablet 2  . fluticasone (FLONASE) 50 MCG/ACT nasal spray Place 2 sprays into both nostrils daily. 16 g 6  . travoprost, benzalkonium, (TRAVATAN) 0.004 % ophthalmic solution Place 1 drop into both eyes at bedtime.     No facility-administered medications prior to visit.     ROS Review of Systems  Constitutional: Negative.   Respiratory: Negative.   Cardiovascular: Negative.   Gastrointestinal: Negative.      Objective:  BP 115/81 (BP Location: Left Arm, Patient Position: Sitting, Cuff Size: Normal)   Pulse 76   Temp 98.3 F (36.8 C) (Oral)   Resp 18   Ht 5\' 7"  (1.702 m)   Wt 225 lb 9.6 oz (102.3 kg)   SpO2 97%   BMI  35.33 kg/m   BP/Weight 04/22/2017 03/31/2017 93/73/4287  Systolic BP 681 157 262  Diastolic BP 81 80 75  Wt. (Lbs) 225.6 - 221.4  BMI 35.33 - 34.68     Physical Exam  Constitutional: She appears well-developed and well-nourished.  Eyes: Conjunctivae are normal. Pupils are equal, round, and reactive to light.  Neck: No JVD present.  Cardiovascular: Normal rate, regular rhythm, normal heart sounds and intact distal pulses.  Pulmonary/Chest: Effort normal and breath sounds normal.  Abdominal: Soft. Bowel sounds are normal. There is no tenderness.  Skin: Skin is warm and dry.  Psychiatric: She expresses no homicidal and no suicidal ideation. She expresses no suicidal plans and no homicidal plans.  Tearful.  Nursing note and vitals reviewed.   Assessment & Plan:   1. Decreased GFR -Encouraged patient that by taking her antihypertensive medications, limiting her salt intake, increasing physical activity, and avoiding use of NSAID drugs can all help to preserve kidney function. -Follow-up labs to reassess kidney function. - Basic metabolic panel  2. Essential hypertension  - Basic metabolic panel    Follow-up: Return in about 2 months (around 06/23/2017) for HTN/HLD.   Alfonse Spruce FNP

## 2017-05-12 ENCOUNTER — Telehealth: Payer: Self-pay

## 2017-05-12 ENCOUNTER — Other Ambulatory Visit: Payer: Self-pay | Admitting: Pharmacist

## 2017-05-12 ENCOUNTER — Telehealth: Payer: Self-pay | Admitting: Family Medicine

## 2017-05-12 ENCOUNTER — Telehealth (INDEPENDENT_AMBULATORY_CARE_PROVIDER_SITE_OTHER): Payer: Self-pay | Admitting: *Deleted

## 2017-05-12 DIAGNOSIS — I10 Essential (primary) hypertension: Secondary | ICD-10-CM

## 2017-05-12 MED ORDER — AMLODIPINE BESYLATE 10 MG PO TABS: 10.0000 mg | ORAL_TABLET | Freq: Every day | ORAL | 0 refills | Status: DC

## 2017-05-12 NOTE — Telephone Encounter (Signed)
-----   Message from Alfonse Spruce, [place] sent at 05/03/2017 10:17 AM EST ----- Labs that evaluate kidney function show significant improvement and are normal. Continue to take your medications for blood pressure, reduce salt intake to 2 to 4 grams/day, do not smoke. Recommend follow up in 3 months.

## 2017-05-12 NOTE — Telephone Encounter (Signed)
Medical Assistant left message on patient's home and cell voicemail. Voicemail states to give a call back to [place] with [facility] at [phone]. !!!Please inform patient of kidney function showing improvement with a normal result. Patient should continue with current medications and reduce smoking and salt intake ad follow up will be completed in 3 months, schedule appt if not done so already!!!

## 2017-05-12 NOTE — Telephone Encounter (Signed)
Pt contacted the office and requested a copy of 03-17-17 lab results. Results are printed and placed up front in binder

## 2017-05-12 NOTE — Telephone Encounter (Signed)
Pt. Returned call and was informed of lab results.

## 2017-06-11 ENCOUNTER — Telehealth: Payer: Self-pay | Admitting: Family Medicine

## 2017-06-11 DIAGNOSIS — E782 Mixed hyperlipidemia: Secondary | ICD-10-CM

## 2017-06-11 DIAGNOSIS — I10 Essential (primary) hypertension: Secondary | ICD-10-CM

## 2017-06-11 MED ORDER — CLONIDINE HCL 0.1 MG PO TABS: 0.1000 mg | ORAL_TABLET | Freq: Two times a day (BID) | ORAL | 0 refills | Status: DC

## 2017-06-11 MED ORDER — AMLODIPINE BESYLATE 10 MG PO TABS: 10.0000 mg | ORAL_TABLET | Freq: Every day | ORAL | 0 refills | Status: DC

## 2017-06-11 MED ORDER — ATORVASTATIN CALCIUM 20 MG PO TABS: 20.0000 mg | ORAL_TABLET | Freq: Every day | ORAL | 0 refills | Status: DC

## 2017-06-11 NOTE — Telephone Encounter (Signed)
Refills sent

## 2017-06-11 NOTE — Telephone Encounter (Signed)
Pt called to request a refill for amLODipine (NORVASC) 10 MG tablet atorvastatin (LIPITOR) 20 MG tablet cloNIDine (CATAPRES) 0.1 MG tablet Please sent it to [id number] - [place], [place] - [address] Please follow up

## 2017-06-14 ENCOUNTER — Telehealth: Payer: Self-pay | Admitting: Family Medicine

## 2017-06-14 NOTE — Telephone Encounter (Signed)
Patient stated [facility] keeps calling you stating that refills have to be put in place in order for them to give her regarding Amlodipine, patient was not given that medication.

## 2017-06-15 ENCOUNTER — Telehealth: Payer: Self-pay | Admitting: Family Medicine

## 2017-06-15 DIAGNOSIS — E782 Mixed hyperlipidemia: Secondary | ICD-10-CM

## 2017-06-15 DIAGNOSIS — I10 Essential (primary) hypertension: Secondary | ICD-10-CM

## 2017-06-15 MED ORDER — CLONIDINE HCL 0.1 MG PO TABS: 0.1000 mg | ORAL_TABLET | Freq: Two times a day (BID) | ORAL | 0 refills | Status: DC

## 2017-06-15 MED ORDER — ATORVASTATIN CALCIUM 20 MG PO TABS: 20.0000 mg | ORAL_TABLET | Freq: Every day | ORAL | 0 refills | Status: DC

## 2017-06-15 MED ORDER — AMLODIPINE BESYLATE 10 MG PO TABS: 10.0000 mg | ORAL_TABLET | Freq: Every day | ORAL | 0 refills | Status: DC

## 2017-06-15 NOTE — Telephone Encounter (Signed)
Patient stopped by and requested for listed medications to be refilled due to her last does to be given this coming Sunday. Patient requested for medications to be sent to [facility]. Patient requested to be called once completed [phone]. atorvastatin (LIPITOR) 20 MG tablet [[id number]]  cloNIDine (CATAPRES) 0.1 MG tablet [[id number]]  amLODipine (NORVASC) 10 MG tablet [[id number]]

## 2017-06-15 NOTE — Telephone Encounter (Signed)
All of these medications were refilled 06/11/17. Called patient and she said the pharmacy said that they did not have them. Resent scripts to requested pharmacy. Patient verbalized understanding.

## 2017-06-25 ENCOUNTER — Encounter: Payer: Self-pay | Admitting: Family Medicine

## 2017-06-25 ENCOUNTER — Ambulatory Visit: Payer: BLUE CROSS/BLUE SHIELD | Attending: Family Medicine | Admitting: Family Medicine

## 2017-06-25 VITALS — BP 128/83 | HR 67 | Temp 98.1°F | Resp 16 | Wt 214.8 lb

## 2017-06-25 DIAGNOSIS — I10 Essential (primary) hypertension: Secondary | ICD-10-CM | POA: Diagnosis not present

## 2017-06-25 DIAGNOSIS — M25511 Pain in right shoulder: Secondary | ICD-10-CM | POA: Diagnosis not present

## 2017-06-25 DIAGNOSIS — M25512 Pain in left shoulder: Secondary | ICD-10-CM | POA: Insufficient documentation

## 2017-06-25 DIAGNOSIS — Z79899 Other long term (current) drug therapy: Secondary | ICD-10-CM | POA: Diagnosis not present

## 2017-06-25 DIAGNOSIS — E782 Mixed hyperlipidemia: Secondary | ICD-10-CM | POA: Insufficient documentation

## 2017-06-25 MED ORDER — CLONIDINE HCL 0.1 MG PO TABS: 0.1000 mg | ORAL_TABLET | Freq: Two times a day (BID) | ORAL | 0 refills | Status: DC

## 2017-06-25 MED ORDER — ATORVASTATIN CALCIUM 20 MG PO TABS: 20.0000 mg | ORAL_TABLET | Freq: Every day | ORAL | 0 refills | Status: DC

## 2017-06-25 MED ORDER — AMLODIPINE BESYLATE 10 MG PO TABS: 10.0000 mg | ORAL_TABLET | Freq: Every day | ORAL | 0 refills | Status: DC

## 2017-06-25 MED ORDER — IBUPROFEN 600 MG PO TABS: 600.0000 mg | ORAL_TABLET | Freq: Three times a day (TID) | ORAL | 0 refills | Status: DC | PRN

## 2017-06-25 MED ORDER — BLOOD PRESSURE CUFF MISC: 1.0000 | Freq: Once | 0 refills | Status: AC

## 2017-06-25 NOTE — Progress Notes (Signed)
Subjective:  Patient ID: Connie Bryant, female    DOB: 26-Feb-1966  Age: 52 y.o. MRN: [id number]  CC: Follow-up (HTN and HLD) and Shoulder Pain   HPI Connie Bryant presents for   Hypertension   Disease Monitoring  Blood pressure range: SBP 120's  Chest pain: no   Dyspnea: no   Claudication: no   Medication compliance: yes  Medication Side Effects  Lightheadedness: no   Urinary frequency: no   Edema: no    Preventitive Healthcare:  Exercise: no   Diet Pattern: low sodium  Salt Restriction: no   Shoulder pain Onset 2 weeks ago. Left shoulder. Symptom include aching. Connie Bryant denies any swelling, decreased ROM, and paresthesias. 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 Connie Bryant reports taking tylenol with for symptoms with minimal relief of symptoms.     Outpatient Medications Prior to Visit  Medication Sig Dispense Refill  . amLODipine (NORVASC) 10 MG tablet Take 1 tablet (10 mg total) by mouth daily. 90 tablet 0  . atorvastatin (LIPITOR) 20 MG tablet Take 1 tablet (20 mg total) by mouth daily. 90 tablet 0  . cloNIDine (CATAPRES) 0.1 MG tablet Take 1 tablet (0.1 mg total) by mouth 2 (two) times daily. 180 tablet 0  . fluticasone (FLONASE) 50 MCG/ACT nasal spray Place 2 sprays into both nostrils daily. 16 g 6  . travoprost, benzalkonium, (TRAVATAN) 0.004 % ophthalmic solution Place 1 drop into both eyes at bedtime.     No facility-administered medications prior to visit.     ROS Review of Systems  Constitutional: Negative.   Respiratory: Negative.   Cardiovascular: Negative.   Gastrointestinal: Negative.   Musculoskeletal: Positive for arthralgias.  Skin: Negative.   Psychiatric/Behavioral: Negative.     Objective:  BP 128/83   Pulse 67   Temp 98.1 F (36.7 C) (Oral)   Resp 16   Wt 214 lb 12.8 oz (97.4 kg)   SpO2 99%   BMI 33.64 kg/m   BP/Weight 06/25/2017 04/22/2017 83/38/2505  Systolic BP 397 673 419  Diastolic BP 83 81 80  Wt. (Lbs) 214.8 225.6 -    BMI 33.64 35.33 -     Physical Exam  Constitutional: Connie Bryant appears well-developed and well-nourished.  Eyes: Conjunctivae are normal. Pupils are equal, round, and reactive to light.  Neck: No JVD present.  Cardiovascular: Normal rate, regular rhythm, normal heart sounds and intact distal pulses.  Pulmonary/Chest: Effort normal and breath sounds normal.  Abdominal: Soft. Bowel sounds are normal.  Musculoskeletal:       Right shoulder: Connie Bryant exhibits pain. Connie Bryant exhibits normal range of motion and no swelling.  Skin: Skin is warm and dry.  Psychiatric: Connie Bryant has a normal mood and affect.  Nursing note and vitals reviewed.   Assessment & Plan:   1. Essential hypertension  - amLODipine (NORVASC) 10 MG tablet; Take 1 tablet (10 mg total) by mouth daily.  Dispense: 90 tablet; Refill: 0 - cloNIDine (CATAPRES) 0.1 MG tablet; Take 1 tablet (0.1 mg total) by mouth 2 (two) times daily.  Dispense: 180 tablet; Refill: 0 - Blood Pressure Monitoring (BLOOD PRESSURE CUFF) MISC; 1 Device by Does not apply route once for 1 dose.  Dispense: 1 each; Refill: 0  2. Acute pain of both shoulders  - ibuprofen (ADVIL,MOTRIN) 600 MG tablet; Take 1 tablet (600 mg total) by mouth every 8 (eight) hours as needed for moderate pain or cramping. Take with food.  Dispense: 40 tablet; Refill: 0  3. Mixed hyperlipidemia  -  atorvastatin (LIPITOR) 20 MG tablet; Take 1 tablet (20 mg total) by mouth daily.  Dispense: 90 tablet; Refill: 0    Follow-up: Return in about 3 months (around 09/22/2017) for HTN/HLD.   Alfonse Spruce FNP

## 2017-06-25 NOTE — Progress Notes (Signed)
Pt states she is having pain I left shoulder  Pt states she has told the provider that the tylenol doesn't work

## 2017-06-25 NOTE — Patient Instructions (Addendum)
Shoulder Pain Many things can cause shoulder pain, including:  An injury to the area.  Overuse of the shoulder.  Arthritis.  The source of the pain can be:  Inflammation.  An injury to the shoulder joint.  An injury to a tendon, ligament, or bone.  Follow these instructions at home: Take these actions to help with your pain:  Squeeze a soft ball or a foam pad as much as possible. This helps to keep the shoulder from swelling. It also helps to strengthen the arm.  Take over-the-counter and prescription medicines only as told by your health care provider.  If directed, apply ice to the area: ? Put ice in a plastic bag. ? Place a towel between your skin and the bag. ? Leave the ice on for 20 minutes, 2-3 times per day. Stop applying ice if it does not help with the pain.  If you were given a shoulder sling or immobilizer: ? Wear it as told. ? Remove it to shower or bathe. ? Move your arm as little as possible, but keep your hand moving to prevent swelling.  Contact a health care provider if:  Your pain gets worse.  Your pain is not relieved with medicines.  New pain develops in your arm, hand, or fingers. Get help right away if:  Your arm, hand, or fingers: ? Tingle. ? Become numb. ? Become swollen. ? Become painful. ? Turn white or blue. This information is not intended to replace advice given to you by your health care provider. Make sure you discuss any questions you have with your health care provider. Document Released: 02/11/2005 Document Revised: 12/29/2015 Document Reviewed: 08/27/2014 Elsevier Interactive Patient Education  2018 [employer].   Managing Your Hypertension Hypertension is commonly called high blood pressure. This is when the force of your blood pressing against the walls of your arteries is too strong. Arteries are blood vessels that carry blood from your heart throughout your body. Hypertension forces the heart to work harder to pump  blood, and may cause the arteries to become narrow or stiff. Having untreated or uncontrolled hypertension can cause heart attack, stroke, kidney disease, and other problems. What are blood pressure readings? A blood pressure reading consists of a higher number over a lower number. Ideally, your blood pressure should be below 120/80. The first ("top") number is called the systolic pressure. It is a measure of the pressure in your arteries as your heart beats. The second ("bottom") number is called the diastolic pressure. It is a measure of the pressure in your arteries as the heart relaxes. What does my blood pressure reading mean? Blood pressure is classified into four stages. Based on your blood pressure reading, your health care provider may use the following stages to determine what type of treatment you need, if any. Systolic pressure and diastolic pressure are measured in a unit called mm Hg. Normal  Systolic pressure: below 810.  Diastolic pressure: below 80. Elevated  Systolic pressure: 175-102.  Diastolic pressure: below 80. Hypertension stage 1  Systolic pressure: 585-277.  Diastolic pressure: 82-42. Hypertension stage 2  Systolic pressure: 353 or above.  Diastolic pressure: 90 or above. What health risks are associated with hypertension? Managing your hypertension is an important responsibility. Uncontrolled hypertension can lead to:  A heart attack.  A stroke.  A weakened blood vessel (aneurysm).  Heart failure.  Kidney damage.  Eye damage.  Metabolic syndrome.  Memory and concentration problems.  What changes can I make to manage  my hypertension? Hypertension can be managed by making lifestyle changes and possibly by taking medicines. Your health care provider will help you make a plan to bring your blood pressure within a normal range. Eating and drinking  Eat a diet that is high in fiber and potassium, and low in salt (sodium), added sugar, and fat. An  example eating plan is called the DASH (Dietary Approaches to Stop Hypertension) diet. To eat this way: ? Eat plenty of fresh fruits and vegetables. Try to fill half of your plate at each meal with fruits and vegetables. ? Eat whole grains, such as whole wheat pasta, brown rice, or whole grain bread. Fill about one quarter of your plate with whole grains. ? Eat low-fat diary products. ? Avoid fatty cuts of meat, processed or cured meats, and poultry with skin. Fill about one quarter of your plate with lean proteins such as fish, chicken without skin, beans, eggs, and tofu. ? Avoid premade and processed foods. These tend to be higher in sodium, added sugar, and fat.  Reduce your daily sodium intake. Most people with hypertension should eat less than 1,500 mg of sodium a day.  Limit alcohol intake to no more than 1 drink a day for nonpregnant women and 2 drinks a day for men. One drink equals 12 oz of beer, 5 oz of wine, or 1 oz of hard liquor. Lifestyle  Work with your health care provider to maintain a healthy body weight, or to lose weight. Ask what an ideal weight is for you.  Get at least 30 minutes of exercise that causes your heart to beat faster (aerobic exercise) most days of the week. Activities may include walking, swimming, or biking.  Include exercise to strengthen your muscles (resistance exercise), such as weight lifting, as part of your weekly exercise routine. Try to do these types of exercises for 30 minutes at least 3 days a week.  Do not use any products that contain nicotine or tobacco, such as cigarettes and e-cigarettes. If you need help quitting, ask your health care provider.  Control any long-term (chronic) conditions you have, such as high cholesterol or diabetes. Monitoring  Monitor your blood pressure at home as told by your health care provider. Your personal target blood pressure may vary depending on your medical conditions, your age, and other factors.  Have  your blood pressure checked regularly, as often as told by your health care provider. Working with your health care provider  Review all the medicines you take with your health care provider because there may be side effects or interactions.  Talk with your health care provider about your diet, exercise habits, and other lifestyle factors that may be contributing to hypertension.  Visit your health care provider regularly. Your health care provider can help you create and adjust your plan for managing hypertension. Will I need medicine to control my blood pressure? Your health care provider may prescribe medicine if lifestyle changes are not enough to get your blood pressure under control, and if:  Your systolic blood pressure is 130 or higher.  Your diastolic blood pressure is 80 or higher.  Take medicines only as told by your health care provider. Follow the directions carefully. Blood pressure medicines must be taken as prescribed. The medicine does not work as well when you skip doses. Skipping doses also puts you at risk for problems. Contact a health care provider if:  You think you are having a reaction to medicines you have taken.  You have repeated (recurrent) headaches.  You feel dizzy.  You have swelling in your ankles.  You have trouble with your vision. Get help right away if:  You develop a severe headache or confusion.  You have unusual weakness or numbness, or you feel faint.  You have severe pain in your chest or abdomen.  You vomit repeatedly.  You have trouble breathing. Summary  Hypertension is when the force of blood pumping through your arteries is too strong. If this condition is not controlled, it may put you at risk for serious complications.  Your personal target blood pressure may vary depending on your medical conditions, your age, and other factors. For most people, a normal blood pressure is less than 120/80.  Hypertension is managed by  lifestyle changes, medicines, or both. Lifestyle changes include weight loss, eating a healthy, low-sodium diet, exercising more, and limiting alcohol. This information is not intended to replace advice given to you by your health care provider. Make sure you discuss any questions you have with your health care provider. Document Released: 01/27/2012 Document Revised: 04/01/2016 Document Reviewed: 04/01/2016 Elsevier Interactive Patient Education  [employer].

## 2017-09-22 ENCOUNTER — Ambulatory Visit: Payer: BLUE CROSS/BLUE SHIELD | Admitting: Nurse Practitioner

## 2017-09-22 ENCOUNTER — Encounter (INDEPENDENT_AMBULATORY_CARE_PROVIDER_SITE_OTHER): Payer: Self-pay | Admitting: Nurse Practitioner

## 2017-09-22 ENCOUNTER — Ambulatory Visit (INDEPENDENT_AMBULATORY_CARE_PROVIDER_SITE_OTHER): Payer: BLUE CROSS/BLUE SHIELD | Admitting: Nurse Practitioner

## 2017-09-22 ENCOUNTER — Other Ambulatory Visit: Payer: Self-pay

## 2017-09-22 VITALS — BP 119/78 | HR 79 | Temp 98.0°F | Ht 67.0 in | Wt 210.2 lb

## 2017-09-22 DIAGNOSIS — I10 Essential (primary) hypertension: Secondary | ICD-10-CM

## 2017-09-22 DIAGNOSIS — E559 Vitamin D deficiency, unspecified: Secondary | ICD-10-CM

## 2017-09-22 DIAGNOSIS — E782 Mixed hyperlipidemia: Secondary | ICD-10-CM | POA: Diagnosis not present

## 2017-09-22 MED ORDER — AMLODIPINE BESYLATE 10 MG PO TABS: 10.0000 mg | ORAL_TABLET | Freq: Every day | ORAL | 1 refills | Status: DC

## 2017-09-22 MED ORDER — CLONIDINE HCL 0.1 MG PO TABS: 0.1000 mg | ORAL_TABLET | Freq: Two times a day (BID) | ORAL | 1 refills | Status: DC

## 2017-09-22 MED ORDER — ATORVASTATIN CALCIUM 20 MG PO TABS: 20.0000 mg | ORAL_TABLET | Freq: Every day | ORAL | 1 refills | Status: DC

## 2017-09-22 NOTE — Patient Instructions (Addendum)
DASH Eating Plan DASH stands for "Dietary Approaches to Stop Hypertension." The DASH eating plan is a healthy eating plan that has been shown to reduce high blood pressure (hypertension). It may also reduce your risk for type 2 diabetes, heart disease, and stroke. The DASH eating plan may also help with weight loss. What are tips for following this plan? General guidelines  Avoid eating more than 2,300 mg (milligrams) of salt (sodium) a day. If you have hypertension, you may need to reduce your sodium intake to 1,500 mg a day.  Limit alcohol intake to no more than 1 drink a day for nonpregnant women and 2 drinks a day for men. One drink equals 12 oz of beer, 5 oz of wine, or 1 oz of hard liquor.  Work with your health care provider to maintain a healthy body weight or to lose weight. Ask what an ideal weight is for you.  Get at least 30 minutes of exercise that causes your heart to beat faster (aerobic exercise) most days of the week. Activities may include walking, swimming, or biking.  Work with your health care provider or diet and nutrition specialist (dietitian) to adjust your eating plan to your individual calorie needs. Reading food labels  Check food labels for the amount of sodium per serving. Choose foods with less than 5 percent of the Daily Value of sodium. Generally, foods with less than 300 mg of sodium per serving fit into this eating plan.  To find whole grains, look for the word "whole" as the first word in the ingredient list. Shopping  Buy products labeled as "low-sodium" or "no salt added."  Buy fresh foods. Avoid canned foods and premade or frozen meals. Cooking  Avoid adding salt when cooking. Use salt-free seasonings or herbs instead of table salt or sea salt. Check with your health care provider or pharmacist before using salt substitutes.  Do not fry foods. Cook foods using healthy methods such as baking, boiling, grilling, and broiling instead.  Cook with  heart-healthy oils, such as olive, canola, soybean, or sunflower oil. Meal planning   Eat a balanced diet that includes: ? 5 or more servings of fruits and vegetables each day. At each meal, try to fill half of your plate with fruits and vegetables. ? Up to 6-8 servings of whole grains each day. ? Less than 6 oz of lean meat, poultry, or fish each day. A 3-oz serving of meat is about the same size as a deck of cards. One egg equals 1 oz. ? 2 servings of low-fat dairy each day. ? A serving of nuts, seeds, or beans 5 times each week. ? Heart-healthy fats. Healthy fats called Omega-3 fatty acids are found in foods such as flaxseeds and coldwater fish, like sardines, salmon, and mackerel.  Limit how much you eat of the following: ? Canned or prepackaged foods. ? Food that is high in trans fat, such as fried foods. ? Food that is high in saturated fat, such as fatty meat. ? Sweets, desserts, sugary drinks, and other foods with added sugar. ? Full-fat dairy products.  Do not salt foods before eating.  Try to eat at least 2 vegetarian meals each week.  Eat more home-cooked food and less restaurant, buffet, and fast food.  When eating at a restaurant, ask that your food be prepared with less salt or no salt, if possible. What foods are recommended? The items listed may not be a complete list. Talk with your dietitian about what   dietary choices are best for you. Grains Whole-grain or whole-wheat bread. Whole-grain or whole-wheat pasta. Brown rice. Oatmeal. Quinoa. Bulgur. Whole-grain and low-sodium cereals. Pita bread. Low-fat, low-sodium crackers. Whole-wheat flour tortillas. Vegetables Fresh or frozen vegetables (raw, steamed, roasted, or grilled). Low-sodium or reduced-sodium tomato and vegetable juice. Low-sodium or reduced-sodium tomato sauce and tomato paste. Low-sodium or reduced-sodium canned vegetables. Fruits All fresh, dried, or frozen fruit. Canned fruit in natural juice (without  added sugar). Meat and other protein foods Skinless chicken or turkey. Ground chicken or turkey. Pork with fat trimmed off. Fish and seafood. Egg whites. Dried beans, peas, or lentils. Unsalted nuts, nut butters, and seeds. Unsalted canned beans. Lean cuts of beef with fat trimmed off. Low-sodium, lean deli meat. Dairy Low-fat (1%) or fat-free (skim) milk. Fat-free, low-fat, or reduced-fat cheeses. Nonfat, low-sodium ricotta or cottage cheese. Low-fat or nonfat yogurt. Low-fat, low-sodium cheese. Fats and oils Soft margarine without trans fats. Vegetable oil. Low-fat, reduced-fat, or light mayonnaise and salad dressings (reduced-sodium). Canola, safflower, olive, soybean, and sunflower oils. Avocado. Seasoning and other foods Herbs. Spices. Seasoning mixes without salt. Unsalted popcorn and pretzels. Fat-free sweets. What foods are not recommended? The items listed may not be a complete list. Talk with your dietitian about what dietary choices are best for you. Grains Baked goods made with fat, such as croissants, muffins, or some breads. Dry pasta or rice meal packs. Vegetables Creamed or fried vegetables. Vegetables in a cheese sauce. Regular canned vegetables (not low-sodium or reduced-sodium). Regular canned tomato sauce and paste (not low-sodium or reduced-sodium). Regular tomato and vegetable juice (not low-sodium or reduced-sodium). Pickles. Olives. Fruits Canned fruit in a light or heavy syrup. Fried fruit. Fruit in cream or butter sauce. Meat and other protein foods Fatty cuts of meat. Ribs. Fried meat. Bacon. Sausage. Bologna and other processed lunch meats. Salami. Fatback. Hotdogs. Bratwurst. Salted nuts and seeds. Canned beans with added salt. Canned or smoked fish. Whole eggs or egg yolks. Chicken or turkey with skin. Dairy Whole or 2% milk, cream, and half-and-half. Whole or full-fat cream cheese. Whole-fat or sweetened yogurt. Full-fat cheese. Nondairy creamers. Whipped toppings.  Processed cheese and cheese spreads. Fats and oils Butter. Stick margarine. Lard. Shortening. Ghee. Bacon fat. Tropical oils, such as coconut, palm kernel, or palm oil. Seasoning and other foods Salted popcorn and pretzels. Onion salt, garlic salt, seasoned salt, table salt, and sea salt. Worcestershire sauce. Tartar sauce. Barbecue sauce. Teriyaki sauce. Soy sauce, including reduced-sodium. Steak sauce. Canned and packaged gravies. Fish sauce. Oyster sauce. Cocktail sauce. Horseradish that you find on the shelf. Ketchup. Mustard. Meat flavorings and tenderizers. Bouillon cubes. Hot sauce and Tabasco sauce. Premade or packaged marinades. Premade or packaged taco seasonings. Relishes. Regular salad dressings. Where to find more information:  National Heart, Lung, and Blood Institute: www.nhlbi.nih.gov  American Heart Association: www.heart.org Summary  The DASH eating plan is a healthy eating plan that has been shown to reduce high blood pressure (hypertension). It may also reduce your risk for type 2 diabetes, heart disease, and stroke.  With the DASH eating plan, you should limit salt (sodium) intake to 2,300 mg a day. If you have hypertension, you may need to reduce your sodium intake to 1,500 mg a day.  When on the DASH eating plan, aim to eat more fresh fruits and vegetables, whole grains, lean proteins, low-fat dairy, and heart-healthy fats.  Work with your health care provider or diet and nutrition specialist (dietitian) to adjust your eating plan to your individual   calorie needs. This information is not intended to replace advice given to you by your health care provider. Make sure you discuss any questions you have with your health care provider. Document Released: 04/23/2011 Document Revised: 04/27/2016 Document Reviewed: 04/27/2016 Elsevier Interactive Patient Education  2018 [employer].  Hypertension Hypertension is another name for high blood pressure. High blood pressure  forces your heart to work harder to pump blood. This can cause problems over time. There are two numbers in a blood pressure reading. There is a top number (systolic) over a bottom number (diastolic). It is best to have a blood pressure below 120/80. Healthy choices can help lower your blood pressure. You may need medicine to help lower your blood pressure if:  Your blood pressure cannot be lowered with healthy choices.  Your blood pressure is higher than 130/80.  Follow these instructions at home: Eating and drinking  If directed, follow the DASH eating plan. This diet includes: ? Filling half of your plate at each meal with fruits and vegetables. ? Filling one quarter of your plate at each meal with whole grains. Whole grains include whole wheat pasta, brown rice, and whole grain bread. ? Eating or drinking low-fat dairy products, such as skim milk or low-fat yogurt. ? Filling one quarter of your plate at each meal with low-fat (lean) proteins. Low-fat proteins include fish, skinless chicken, eggs, beans, and tofu. ? Avoiding fatty meat, cured and processed meat, or chicken with skin. ? Avoiding premade or processed food.  Eat less than 1,500 mg of salt (sodium) a day.  Limit alcohol use to no more than 1 drink a day for nonpregnant women and 2 drinks a day for men. One drink equals 12 oz of beer, 5 oz of wine, or 1 oz of hard liquor. Lifestyle  Work with your doctor to stay at a healthy weight or to lose weight. Ask your doctor what the best weight is for you.  Get at least 30 minutes of exercise that causes your heart to beat faster (aerobic exercise) most days of the week. This may include walking, swimming, or biking.  Get at least 30 minutes of exercise that strengthens your muscles (resistance exercise) at least 3 days a week. This may include lifting weights or pilates.  Do not use any products that contain nicotine or tobacco. This includes cigarettes and e-cigarettes. If you  need help quitting, ask your doctor.  Check your blood pressure at home as told by your doctor.  Keep all follow-up visits as told by your doctor. This is important. Medicines  Take over-the-counter and prescription medicines only as told by your doctor. Follow directions carefully.  Do not skip doses of blood pressure medicine. The medicine does not work as well if you skip doses. Skipping doses also puts you at risk for problems.  Ask your doctor about side effects or reactions to medicines that you should watch for. Contact a doctor if:  You think you are having a reaction to the medicine you are taking.  You have headaches that keep coming back (recurring).  You feel dizzy.  You have swelling in your ankles.  You have trouble with your vision. Get help right away if:  You get a very bad headache.  You start to feel confused.  You feel weak or numb.  You feel faint.  You get very bad pain in your: ? Chest. ? Belly (abdomen).  You throw up (vomit) more than once.  You have trouble breathing.   Summary  Hypertension is another name for high blood pressure.  Making healthy choices can help lower blood pressure. If your blood pressure cannot be controlled with healthy choices, you may need to take medicine. This information is not intended to replace advice given to you by your health care provider. Make sure you discuss any questions you have with your health care provider. Document Released: 10/21/2007 Document Revised: 04/01/2016 Document Reviewed: 04/01/2016 Elsevier Interactive Patient Education  2018 Elsevier Inc.  

## 2017-09-22 NOTE — Progress Notes (Signed)
Assessment & Plan:  Connie Bryant was seen today for new patient (initial visit).  Diagnoses and all orders for this visit:  Essential hypertension -     CMP14+EGFR -     cloNIDine (CATAPRES) 0.1 MG tablet; Take 1 tablet (0.1 mg total) by mouth 2 (two) times daily. -     amLODipine (NORVASC) 10 MG tablet; Take 1 tablet (10 mg total) by mouth daily. Continue all antihypertensives as prescribed.  Remember to bring in your blood pressure log with you for your follow up appointment.  DASH/Mediterranean Diets are healthier choices for HTN.    Mixed hyperlipidemia -     Lipid panel -     atorvastatin (LIPITOR) 20 MG tablet; Take 1 tablet (20 mg total) by mouth daily. INSTRUCTIONS: Work on a low fat, heart healthy diet and participate in regular aerobic exercise program by working out at least 150 minutes per week. No fried foods. No junk foods, sodas, sugary drinks, unhealthy snacking, alcohol or smoking.     Vitamin D deficiency -     VITAMIN D 25 Hydroxy (Vit-D Deficiency, Fractures)    Patient has been counseled on age-appropriate routine health concerns for screening and prevention. These are reviewed and up-to-date. Referrals have been placed accordingly. Immunizations are up-to-date or declined.    Subjective:   Chief Complaint  Patient presents with  . New Patient (Initial Visit)    HTN/ HLD   HPI Connie Bryant 52 y.o. female presents to office today for follow up to HTN/HPL.   Essential Hypertension Chronic. Stable and well controlled.  She does check her blood pressure intermittently. Average 120-130/60-70s. She avoids cooking with salt. She does not have a formal exercise routine. Denies chest pain, shortness of breath, palpitations, lightheadedness, dizziness, headaches or BLE edema. Endorses medication compliance.  BP Readings from Last 3 Encounters:  09/22/17 119/78  06/25/17 128/83  04/22/17 115/81    Hyperlipidemia Patient presents for follow up to  hyperlipidemia.  She is medication compliant taking atorvastatin. She is somewhat diet compliant and denies chest pain, dyspnea, lower extremity edema, poor exercise tolerance and skin xanthelasma or statin intolerance including myalgias. LDL is at goal .  Lab Results  Component Value Date   CHOL 191 03/17/2017   Lab Results  Component Value Date   HDL 45 03/17/2017   Lab Results  Component Value Date   LDLCALC 131 (H) 03/17/2017   Lab Results  Component Value Date   TRIG 76 03/17/2017   Lab Results  Component Value Date   CHOLHDL 4.2 03/17/2017   Review of Systems  Constitutional: Negative for fever, malaise/fatigue and weight loss.  HENT: Negative.  Negative for nosebleeds.   Eyes: Negative.  Negative for blurred vision, double vision and photophobia.  Respiratory: Negative.  Negative for cough and shortness of breath.   Cardiovascular: Negative.  Negative for chest pain, palpitations and leg swelling.  Gastrointestinal: Negative.  Negative for heartburn, nausea and vomiting.  Musculoskeletal: Negative.  Negative for myalgias.  Neurological: Negative.  Negative for dizziness, focal weakness, seizures and headaches.  Psychiatric/Behavioral: Negative.  Negative for suicidal ideas.    Past Medical History:  Diagnosis Date  . Anemia   . Glaucoma   . Hypertension   . Obesity   . Pneumonia    2008    Past Surgical History:  Procedure Laterality Date  . TUBAL LIGATION      Family History  Problem Relation Age of Onset  . Diabetes Brother   .  Heart disease Brother   . Heart attack Brother   . Diabetes Mother   . Kidney disease Mother   . Heart attack Mother   . Diabetes Sister   . Heart attack Sister   . Breast cancer Cousin     Social History Reviewed with no changes to be made today.   Outpatient Medications Prior to Visit  Medication Sig Dispense Refill  . ibuprofen (ADVIL,MOTRIN) 600 MG tablet Take 1 tablet (600 mg total) by mouth every 8 (eight) hours  as needed for moderate pain or cramping. Take with food. 40 tablet 0  . amLODipine (NORVASC) 10 MG tablet Take 1 tablet (10 mg total) by mouth daily. 90 tablet 0  . atorvastatin (LIPITOR) 20 MG tablet Take 1 tablet (20 mg total) by mouth daily. 90 tablet 0  . cloNIDine (CATAPRES) 0.1 MG tablet Take 1 tablet (0.1 mg total) by mouth 2 (two) times daily. 180 tablet 0  . fluticasone (FLONASE) 50 MCG/ACT nasal spray Place 2 sprays into both nostrils daily. 16 g 6  . travoprost, benzalkonium, (TRAVATAN) 0.004 % ophthalmic solution Place 1 drop into both eyes at bedtime.     No facility-administered medications prior to visit.     Allergies  Allergen Reactions  . Lisinopril Cough  . Oxycodone-Acetaminophen Hives  . Tylox [Oxycodone-Acetaminophen] Hives       Objective:    BP 119/78 (BP Location: Left Arm, Patient Position: Sitting, Cuff Size: Large)   Pulse 79   Temp 98 F (36.7 C) (Oral)   Ht 5' 7" (1.702 m)   Wt 210 lb 3.2 oz (95.3 kg)   SpO2 98%   BMI 32.92 kg/m  Wt Readings from Last 3 Encounters:  09/22/17 210 lb 3.2 oz (95.3 kg)  06/25/17 214 lb 12.8 oz (97.4 kg)  04/22/17 225 lb 9.6 oz (102.3 kg)    Physical Exam  Constitutional: She is oriented to person, place, and time. She appears well-developed and well-nourished. She is cooperative.  HENT:  Head: Normocephalic and atraumatic.  Eyes: EOM are normal.  Neck: Normal range of motion.  Cardiovascular: Normal rate, regular rhythm and normal heart sounds. Exam reveals no gallop and no friction rub.  No murmur heard. Pulmonary/Chest: Effort normal and breath sounds normal. No tachypnea. No respiratory distress. She has no decreased breath sounds. She has no wheezes. She has no rhonchi. She has no rales. She exhibits no tenderness.  Abdominal: Soft. Bowel sounds are normal.  Musculoskeletal: Normal range of motion. She exhibits no edema.  Neurological: She is alert and oriented to person, place, and time. Coordination  normal.  Skin: Skin is warm and dry.  Psychiatric: She has a normal mood and affect. Her behavior is normal. Judgment and thought content normal.  Nursing note and vitals reviewed.      Patient has been counseled extensively about nutrition and exercise as well as the importance of adherence with medications and regular follow-up. The patient was given clear instructions to go to ER or return to medical center if symptoms don't improve, worsen or new problems develop. The patient verbalized understanding.   Follow-up: Return in about 3 months (around 12/23/2017) for HTN/HPL.    W , FNP-BC [facility] [place] [place] [phone]   09/22/2017, 11:37 AM 

## 2017-09-23 LAB — CMP14+EGFR
ALK PHOS: 83 IU/L (ref 39–117)
ALT: 10 IU/L (ref 0–32)
AST: 15 IU/L (ref 0–40)
Albumin/Globulin Ratio: 1.1 — ABNORMAL LOW (ref 1.2–2.2)
Albumin: 4.1 g/dL (ref 3.5–5.5)
BUN/Creatinine Ratio: 12 (ref 9–23)
BUN: 10 mg/dL (ref 6–24)
Bilirubin Total: 0.3 mg/dL (ref 0.0–1.2)
CO2: 24 mmol/L (ref 20–29)
CREATININE: 0.86 mg/dL (ref 0.57–1.00)
Calcium: 9.9 mg/dL (ref 8.7–10.2)
Chloride: 104 mmol/L (ref 96–106)
GFR calc Af Amer: 89 mL/min/{1.73_m2} (ref >59)
GFR calc non Af Amer: 77 mL/min/{1.73_m2} (ref >59)
GLOBULIN, TOTAL: 3.7 g/dL (ref 1.5–4.5)
GLUCOSE: 84 mg/dL (ref 65–99)
Potassium: 4.1 mmol/L (ref 3.5–5.2)
SODIUM: 141 mmol/L (ref 134–144)
Total Protein: 7.8 g/dL (ref 6.0–8.5)

## 2017-09-23 LAB — LIPID PANEL
CHOLESTEROL TOTAL: 142 mg/dL (ref 100–199)
Chol/HDL Ratio: 2.8 ratio (ref 0.0–4.4)
HDL: 51 mg/dL (ref >39)
LDL CALC: 81 mg/dL (ref 0–99)
TRIGLYCERIDES: 50 mg/dL (ref 0–149)
VLDL CHOLESTEROL CAL: 10 mg/dL (ref 5–40)

## 2017-09-23 LAB — VITAMIN D 25 HYDROXY (VIT D DEFICIENCY, FRACTURES): Vit D, 25-Hydroxy: 17.6 ng/mL — ABNORMAL LOW (ref 30.0–100.0)

## 2017-09-27 ENCOUNTER — Other Ambulatory Visit: Payer: Self-pay | Admitting: Nurse Practitioner

## 2017-09-27 MED ORDER — VITAMIN D (ERGOCALCIFEROL) 1.25 MG (50000 UNIT) PO CAPS: 50000.0000 [IU] | ORAL_CAPSULE | ORAL | 1 refills | Status: DC

## 2017-10-01 ENCOUNTER — Telehealth (INDEPENDENT_AMBULATORY_CARE_PROVIDER_SITE_OTHER): Payer: Self-pay

## 2017-10-01 NOTE — Telephone Encounter (Signed)
-----   Message from Gildardo Pounds, NP sent at 09/27/2017  7:56 PM EDT ----- Vitamin D is still low. Will send scrip to pharmacy for weekly vitamin D. All of your other Labs are essentially normal. Make sure you are drinking at least 48 oz of water per day. Work on eating a low fat, heart healthy diet and participate in regular aerobic exercise program to control as well. Exercise at least 150 minutes per week.

## 2017-10-01 NOTE — Telephone Encounter (Signed)
Left patient a message asking to return call to [facility] at [facility]. Nat Christen, CMA

## 2017-10-06 ENCOUNTER — Telehealth (INDEPENDENT_AMBULATORY_CARE_PROVIDER_SITE_OTHER): Payer: Self-pay

## 2017-10-06 NOTE — Telephone Encounter (Signed)
Patient is aware that vitamin D is still low. And that prescription for weekly Vit D  has been sent to the pharmacy. Patient encouraged to drink 48oz of water daily, eat a low fat heart healthy diet and participate in regular aerobic exercise and exercise at least 150 minutes weekly. Nat Christen, CMA

## 2017-10-06 NOTE — Telephone Encounter (Signed)
-----   Message from Gildardo Pounds, NP sent at 09/27/2017  7:56 PM EDT ----- Vitamin D is still low. Will send scrip to pharmacy for weekly vitamin D. All of your other Labs are essentially normal. Make sure you are drinking at least 48 oz of water per day. Work on eating a low fat, heart healthy diet and participate in regular aerobic exercise program to control as well. Exercise at least 150 minutes per week.

## 2018-02-07 ENCOUNTER — Ambulatory Visit: Payer: BLUE CROSS/BLUE SHIELD | Attending: Nurse Practitioner | Admitting: Nurse Practitioner

## 2018-02-07 ENCOUNTER — Encounter: Payer: Self-pay | Admitting: Nurse Practitioner

## 2018-02-07 VITALS — BP 127/83 | HR 71 | Temp 98.8°F | Ht 67.0 in | Wt 210.6 lb

## 2018-02-07 DIAGNOSIS — D259 Leiomyoma of uterus, unspecified: Secondary | ICD-10-CM | POA: Insufficient documentation

## 2018-02-07 DIAGNOSIS — Z8249 Family history of ischemic heart disease and other diseases of the circulatory system: Secondary | ICD-10-CM | POA: Diagnosis not present

## 2018-02-07 DIAGNOSIS — Z79899 Other long term (current) drug therapy: Secondary | ICD-10-CM | POA: Diagnosis not present

## 2018-02-07 DIAGNOSIS — N920 Excessive and frequent menstruation with regular cycle: Secondary | ICD-10-CM | POA: Insufficient documentation

## 2018-02-07 DIAGNOSIS — H409 Unspecified glaucoma: Secondary | ICD-10-CM | POA: Diagnosis not present

## 2018-02-07 DIAGNOSIS — M25512 Pain in left shoulder: Secondary | ICD-10-CM

## 2018-02-07 DIAGNOSIS — I10 Essential (primary) hypertension: Secondary | ICD-10-CM | POA: Insufficient documentation

## 2018-02-07 DIAGNOSIS — Z1211 Encounter for screening for malignant neoplasm of colon: Secondary | ICD-10-CM

## 2018-02-07 DIAGNOSIS — E559 Vitamin D deficiency, unspecified: Secondary | ICD-10-CM

## 2018-02-07 DIAGNOSIS — E782 Mixed hyperlipidemia: Secondary | ICD-10-CM | POA: Diagnosis not present

## 2018-02-07 DIAGNOSIS — D649 Anemia, unspecified: Secondary | ICD-10-CM | POA: Diagnosis not present

## 2018-02-07 DIAGNOSIS — E669 Obesity, unspecified: Secondary | ICD-10-CM | POA: Diagnosis not present

## 2018-02-07 DIAGNOSIS — N938 Other specified abnormal uterine and vaginal bleeding: Secondary | ICD-10-CM | POA: Insufficient documentation

## 2018-02-07 DIAGNOSIS — M25511 Pain in right shoulder: Secondary | ICD-10-CM

## 2018-02-07 MED ORDER — IBUPROFEN 600 MG PO TABS: 600.0000 mg | ORAL_TABLET | Freq: Three times a day (TID) | ORAL | 1 refills | Status: DC | PRN

## 2018-02-07 NOTE — Progress Notes (Signed)
Assessment & Plan:  Connie Bryant was seen today for establish care.  Diagnoses and all orders for this visit:  Uterine bleeding, dysfunctional -     CBC -     Follicle Stimulating Hormone -     Ambulatory referral to Gynecology -     US PELVIS (TRANSABDOMINAL ONLY); Future -     US PELVIS TRANSVANGINAL NON-OB (TV ONLY); Future  Essential hypertension -     CMP14+EGFR Continue all antihypertensives as prescribed.  Remember to bring in your blood pressure log with you for your follow up appointment.  DASH/Mediterranean Diets are healthier choices for HTN.    Mixed hyperlipidemia -     Lipid panel INSTRUCTIONS: Work on a low fat, heart healthy diet and participate in regular aerobic exercise program by working out at least 150 minutes per week; 5 days a week-30 minutes per day. Avoid red meat, fried foods. junk foods, sodas, sugary drinks, unhealthy snacking, alcohol and smoking.  Drink at least 48oz of water per day and monitor your carbohydrate intake daily.    Vitamin D deficiency -     VITAMIN D 25 Hydroxy (Vit-D Deficiency, Fractures)  Colon cancer screening -     Ambulatory referral to Gastroenterology  Acute pain of both shoulders -     ibuprofen (ADVIL,MOTRIN) 600 MG tablet; Take 1 tablet (600 mg total) by mouth every 8 (eight) hours as needed for moderate pain or cramping. Take with food. Work on losing weight to help reduce joint pain. May alternate with heat and ice application for pain relief. May also alternate with acetaminophenas prescribed for pain relief. Other alternatives include massage, acupuncture and water aerobics.  You must stay active and avoid a sedentary lifestyle.   Patient has been counseled on age-appropriate routine health concerns for screening and prevention. These are reviewed and up-to-date. Referrals have been placed accordingly. Immunizations are up-to-date or declined.    Subjective:   Chief Complaint  Patient presents with  . Establish Care      Pt. stated her menstrual cycle is still going on since September 8th.    HPI Connie Bryant 52 y.o. female presents to office today to establish care. She has a history of HTN, HPL and menorrhagia with fibroids. Was previously taking megace and iron pills for anemia. She had a hysterectomy scheduled about 3 years ago however it was canceled due to history of premature CAD and requiring cardiac workup. She saw cardiology and was cleared for suregey however she never rescheduled. She had an appointment with her GYN on 06-18-2016 and at that time endorsed amenorrhea since 06-2014. FSH was obtained and noted to be 23.3. She was diagnosed as postmenopausal at that time.  Today she reports she restarted her menstrual cycle in June which lasted for 7 days then her menstrual cycle restarted on September 8th she had a full cycle of 7 days and since then she continues to spot.    CHRONIC HYPERTENSION Disease Monitoring  Blood pressure range BP Readings from Last 3 Encounters:  02/07/18 127/83  09/22/17 119/78  06/25/17 128/83   Chest pain: no   Dyspnea: no   Claudication: no  Medication compliance: yes  Medication Side Effects  Lightheadedness: no   Urinary frequency: no   Edema: no   Impotence: no  Preventitive Healthcare:  Exercise: no   Diet Pattern: diet: general  Salt Restriction:  no    Hyperlipidemia Patient presents for follow up to hyperlipidemia.  She is medication compliant taking  atorvastatin 28m. She is diet compliant and denies skin xanthelasma or statin intolerance including myalgias.  Lab Results  Component Value Date   CHOL 142 09/22/2017   Lab Results  Component Value Date   HDL 51 09/22/2017   Lab Results  Component Value Date   LDLCALC 81 09/22/2017   Lab Results  Component Value Date   TRIG 50 09/22/2017   Lab Results  Component Value Date   CHOLHDL 2.8 09/22/2017   Review of Systems  Constitutional: Negative for fever, malaise/fatigue and weight  loss.  HENT: Negative.  Negative for nosebleeds.   Eyes: Negative.  Negative for blurred vision, double vision and photophobia.  Respiratory: Negative.  Negative for cough and shortness of breath.   Cardiovascular: Negative.  Negative for chest pain, palpitations and leg swelling.  Gastrointestinal: Negative.  Negative for heartburn, nausea and vomiting.  Genitourinary:       SEE HPI  Musculoskeletal: Positive for joint pain (bilateral shoulders). Negative for falls and myalgias.  Neurological: Negative.  Negative for dizziness, focal weakness, seizures and headaches.  Psychiatric/Behavioral: Negative.  Negative for suicidal ideas.     Past Medical History:  Diagnosis Date  . Anemia   . Glaucoma   . Hypertension   . Obesity   . Pneumonia    2008    Past Surgical History:  Procedure Laterality Date  . TUBAL LIGATION      Family History  Problem Relation Age of Onset  . Diabetes Brother   . Heart disease Brother   . Heart attack Brother   . Diabetes Mother   . Kidney disease Mother   . Heart attack Mother   . Diabetes Sister   . Heart attack Sister   . Breast cancer Cousin     Social History Reviewed with no changes to be made today.   Outpatient Medications Prior to Visit  Medication Sig Dispense Refill  . amLODipine (NORVASC) 10 MG tablet Take 1 tablet (10 mg total) by mouth daily. 90 tablet 1  . atorvastatin (LIPITOR) 20 MG tablet Take 1 tablet (20 mg total) by mouth daily. 90 tablet 1  . cloNIDine (CATAPRES) 0.1 MG tablet Take 1 tablet (0.1 mg total) by mouth 2 (two) times daily. 180 tablet 1  . ibuprofen (ADVIL,MOTRIN) 600 MG tablet Take 1 tablet (600 mg total) by mouth every 8 (eight) hours as needed for moderate pain or cramping. Take with food. 40 tablet 0  . Vitamin D, Ergocalciferol, (DRISDOL) 50000 units CAPS capsule Take 1 capsule (50,000 Units total) by mouth every 7 (seven) days. (Patient not taking: Reported on 02/07/2018) 12 capsule 1   No  facility-administered medications prior to visit.     Allergies  Allergen Reactions  . Lisinopril Cough  . Oxycodone-Acetaminophen Hives  . Tylox [Oxycodone-Acetaminophen] Hives       Objective:    BP 127/83 (BP Location: Left Arm, Patient Position: Sitting, Cuff Size: Large)   Pulse 71   Temp 98.8 F (37.1 C) (Oral)   Ht 5' 7"  (1.702 m)   Wt 210 lb 9.6 oz (95.5 kg)   LMP 01/23/2018   SpO2 98%   BMI 32.98 kg/m  Wt Readings from Last 3 Encounters:  02/07/18 210 lb 9.6 oz (95.5 kg)  09/22/17 210 lb 3.2 oz (95.3 kg)  06/25/17 214 lb 12.8 oz (97.4 kg)    Physical Exam  Constitutional: She is oriented to person, place, and time. She appears well-developed and well-nourished. She is cooperative.  HENT:  Head: Normocephalic and atraumatic.  Eyes: EOM are normal.  Neck: Normal range of motion.  Cardiovascular: Normal rate, regular rhythm and normal heart sounds. Exam reveals no gallop and no friction rub.  No murmur heard. Pulmonary/Chest: Effort normal and breath sounds normal. No tachypnea. No respiratory distress. She has no decreased breath sounds. She has no wheezes. She has no rhonchi. She has no rales. She exhibits no tenderness.  Abdominal: Bowel sounds are normal.  Musculoskeletal: Normal range of motion. She exhibits no edema.       Right shoulder: She exhibits tenderness. She exhibits normal range of motion.       Left shoulder: She exhibits tenderness. She exhibits normal range of motion.  Neurological: She is alert and oriented to person, place, and time. Coordination normal.  Skin: Skin is warm and dry.  Psychiatric: She has a normal mood and affect. Her behavior is normal. Judgment and thought content normal.  Nursing note and vitals reviewed.     Patient has been counseled extensively about nutrition and exercise as well as the importance of adherence with medications and regular follow-up. The patient was given clear instructions to go to ER or return to  medical center if symptoms don't improve, worsen or new problems develop. The patient verbalized understanding.   Follow-up: Return in about 3 months (around 05/09/2018) for HTN; HPL.   Gildardo Pounds, FNP-BC [facility] and [place], [place]   02/07/2018, 4:53 PM

## 2018-02-08 LAB — CMP14+EGFR
A/G RATIO: 1.2 (ref 1.2–2.2)
ALK PHOS: 91 IU/L (ref 39–117)
ALT: 11 IU/L (ref 0–32)
AST: 14 IU/L (ref 0–40)
Albumin: 4 g/dL (ref 3.5–5.5)
BILIRUBIN TOTAL: 0.3 mg/dL (ref 0.0–1.2)
BUN/Creatinine Ratio: 14 (ref 9–23)
BUN: 12 mg/dL (ref 6–24)
CHLORIDE: 101 mmol/L (ref 96–106)
CO2: 23 mmol/L (ref 20–29)
Calcium: 10 mg/dL (ref 8.7–10.2)
Creatinine, Ser: 0.84 mg/dL (ref 0.57–1.00)
GFR calc Af Amer: 92 mL/min/{1.73_m2} (ref >59)
GFR, EST NON AFRICAN AMERICAN: 80 mL/min/{1.73_m2} (ref >59)
GLOBULIN, TOTAL: 3.3 g/dL (ref 1.5–4.5)
Glucose: 81 mg/dL (ref 65–99)
POTASSIUM: 4.2 mmol/L (ref 3.5–5.2)
SODIUM: 140 mmol/L (ref 134–144)
Total Protein: 7.3 g/dL (ref 6.0–8.5)

## 2018-02-08 LAB — LIPID PANEL
CHOL/HDL RATIO: 2.7 ratio (ref 0.0–4.4)
Cholesterol, Total: 125 mg/dL (ref 100–199)
HDL: 46 mg/dL (ref >39)
LDL CALC: 66 mg/dL (ref 0–99)
TRIGLYCERIDES: 63 mg/dL (ref 0–149)
VLDL CHOLESTEROL CAL: 13 mg/dL (ref 5–40)

## 2018-02-08 LAB — CBC
Hematocrit: 39.7 % (ref 34.0–46.6)
Hemoglobin: 13 g/dL (ref 11.1–15.9)
MCH: 28.3 pg (ref 26.6–33.0)
MCHC: 32.7 g/dL (ref 31.5–35.7)
MCV: 87 fL (ref 79–97)
PLATELETS: 247 10*3/uL (ref 150–450)
RBC: 4.59 x10E6/uL (ref 3.77–5.28)
RDW: 13 % (ref 12.3–15.4)
WBC: 4.7 10*3/uL (ref 3.4–10.8)

## 2018-02-08 LAB — VITAMIN D 25 HYDROXY (VIT D DEFICIENCY, FRACTURES): Vit D, 25-Hydroxy: 34.6 ng/mL (ref 30.0–100.0)

## 2018-02-08 LAB — FOLLICLE STIMULATING HORMONE: FSH: 23.3 m[IU]/mL

## 2018-02-09 ENCOUNTER — Ambulatory Visit (HOSPITAL_COMMUNITY)
Admission: RE | Admit: 2018-02-09 | Discharge: 2018-02-09 | Disposition: A | Discharge status: Home / Self Care | Payer: BLUE CROSS/BLUE SHIELD | Source: Ambulatory Visit | Attending: Nurse Practitioner | Admitting: Nurse Practitioner

## 2018-02-09 DIAGNOSIS — N938 Other specified abnormal uterine and vaginal bleeding: Secondary | ICD-10-CM

## 2018-02-09 DIAGNOSIS — N83201 Unspecified ovarian cyst, right side: Secondary | ICD-10-CM | POA: Diagnosis not present

## 2018-02-09 DIAGNOSIS — N939 Abnormal uterine and vaginal bleeding, unspecified: Secondary | ICD-10-CM | POA: Diagnosis not present

## 2018-02-09 DIAGNOSIS — D259 Leiomyoma of uterus, unspecified: Secondary | ICD-10-CM | POA: Insufficient documentation

## 2018-02-15 ENCOUNTER — Encounter: Payer: Self-pay | Admitting: Family Medicine

## 2018-02-16 ENCOUNTER — Telehealth: Payer: Self-pay

## 2018-02-16 NOTE — Telephone Encounter (Signed)
-----   Message from Gildardo Pounds, NP sent at 02/13/2018  7:21 PM EDT ----- All of your labs are perfectly normal. You can take a vitamin D 800 units daily to help keep your vitamin D levels normal

## 2018-02-16 NOTE — Telephone Encounter (Signed)
CMA attempt to reach patient to inform on results.  No answer and left a VM for patient to call back.  If patient call back, please inform:  All of your labs are perfectly normal. You can take a vitamin D 800 units daily to help keep your vitamin D levels normal  Ultrasound showing multiple fibroids and and 2 simple ovarian cysts. Gynecology will call you to schedule and discuss treatment options.  A letter will be send out to reach patient.

## 2018-02-16 NOTE — Telephone Encounter (Signed)
-----   Message from Gildardo Pounds, NP sent at 02/13/2018  7:23 PM EDT ----- Ultrasound showing multiple fibroids and and 2 simple ovarian cysts. Gynecology will call you to schedule and discuss treatment options.

## 2018-02-18 NOTE — Progress Notes (Signed)
Patient called back.  CMA informed patient on lab results.  Patient understood and is aware Gynecology referral is placed in.

## 2018-03-16 ENCOUNTER — Ambulatory Visit: Payer: BLUE CROSS/BLUE SHIELD | Admitting: Family Medicine

## 2018-04-11 ENCOUNTER — Telehealth: Payer: Self-pay | Admitting: Nurse Practitioner

## 2018-04-11 DIAGNOSIS — E782 Mixed hyperlipidemia: Secondary | ICD-10-CM

## 2018-04-11 DIAGNOSIS — I10 Essential (primary) hypertension: Secondary | ICD-10-CM

## 2018-04-11 MED ORDER — ATORVASTATIN CALCIUM 20 MG PO TABS: 20.0000 mg | ORAL_TABLET | Freq: Every day | ORAL | 0 refills | Status: DC

## 2018-04-11 MED ORDER — CLONIDINE HCL 0.1 MG PO TABS: 0.1000 mg | ORAL_TABLET | Freq: Two times a day (BID) | ORAL | 0 refills | Status: DC

## 2018-04-11 MED ORDER — AMLODIPINE BESYLATE 10 MG PO TABS: 10.0000 mg | ORAL_TABLET | Freq: Every day | ORAL | 0 refills | Status: DC

## 2018-04-11 NOTE — Telephone Encounter (Signed)
1) Medication(s) Requested (by name): BP medicine  Cholesterol medicine 2) Pharmacy of Choice:  [facility] on [address]

## 2018-09-28 ENCOUNTER — Other Ambulatory Visit: Payer: Self-pay | Admitting: Nurse Practitioner

## 2018-09-28 ENCOUNTER — Other Ambulatory Visit: Payer: Self-pay | Admitting: Pharmacist

## 2018-09-28 DIAGNOSIS — E782 Mixed hyperlipidemia: Secondary | ICD-10-CM

## 2018-09-28 MED ORDER — ATORVASTATIN CALCIUM 20 MG PO TABS: 20.0000 mg | ORAL_TABLET | Freq: Every day | ORAL | 0 refills | Status: DC

## 2019-03-25 DIAGNOSIS — H04123 Dry eye syndrome of bilateral lacrimal glands: Secondary | ICD-10-CM | POA: Diagnosis not present

## 2019-03-25 DIAGNOSIS — H40033 Anatomical narrow angle, bilateral: Secondary | ICD-10-CM | POA: Diagnosis not present

## 2019-04-27 ENCOUNTER — Other Ambulatory Visit: Payer: Self-pay | Admitting: Nurse Practitioner

## 2019-04-27 DIAGNOSIS — Z1231 Encounter for screening mammogram for malignant neoplasm of breast: Secondary | ICD-10-CM

## 2019-09-18 DIAGNOSIS — H43393 Other vitreous opacities, bilateral: Secondary | ICD-10-CM | POA: Diagnosis not present

## 2019-09-18 DIAGNOSIS — H40033 Anatomical narrow angle, bilateral: Secondary | ICD-10-CM | POA: Diagnosis not present

## 2020-01-19 ENCOUNTER — Ambulatory Visit
Admission: RE | Admit: 2020-01-19 | Discharge: 2020-01-19 | Disposition: A | Discharge status: Home / Self Care | Payer: BLUE CROSS/BLUE SHIELD | Source: Ambulatory Visit | Attending: Nurse Practitioner | Admitting: Nurse Practitioner

## 2020-01-19 ENCOUNTER — Other Ambulatory Visit: Payer: Self-pay

## 2020-01-19 DIAGNOSIS — Z1231 Encounter for screening mammogram for malignant neoplasm of breast: Secondary | ICD-10-CM | POA: Diagnosis not present

## 2020-09-19 ENCOUNTER — Other Ambulatory Visit: Payer: Self-pay

## 2020-09-19 ENCOUNTER — Encounter: Payer: Self-pay | Admitting: Physician Assistant

## 2020-09-19 ENCOUNTER — Ambulatory Visit: Payer: BC Managed Care – PPO | Attending: Physician Assistant | Admitting: Physician Assistant

## 2020-09-19 VITALS — BP 142/82 | HR 82 | Resp 18 | Ht 68.0 in | Wt 224.0 lb

## 2020-09-19 DIAGNOSIS — R040 Epistaxis: Secondary | ICD-10-CM | POA: Diagnosis not present

## 2020-09-19 DIAGNOSIS — I10 Essential (primary) hypertension: Secondary | ICD-10-CM

## 2020-09-19 DIAGNOSIS — E782 Mixed hyperlipidemia: Secondary | ICD-10-CM

## 2020-09-19 DIAGNOSIS — Z131 Encounter for screening for diabetes mellitus: Secondary | ICD-10-CM

## 2020-09-19 DIAGNOSIS — E559 Vitamin D deficiency, unspecified: Secondary | ICD-10-CM

## 2020-09-19 MED ORDER — CLONIDINE HCL 0.1 MG PO TABS: 0.1000 mg | ORAL_TABLET | Freq: Two times a day (BID) | ORAL | 0 refills | Status: DC

## 2020-09-19 MED ORDER — AMLODIPINE BESYLATE 10 MG PO TABS: 10.0000 mg | ORAL_TABLET | Freq: Every day | ORAL | 0 refills | Status: DC

## 2020-09-19 MED ORDER — ATORVASTATIN CALCIUM 20 MG PO TABS: 20.0000 mg | ORAL_TABLET | Freq: Every day | ORAL | 0 refills | Status: DC

## 2020-09-19 NOTE — Progress Notes (Signed)
Patient ID: Connie Bryant, female   DOB: 06-29-1965, 55 y.o.   MRN: [id number]     Connie Bryant, is a 55 y.o. female  HCW:[id number]  VVO:[id number]  DOB - 1965/10/09  Subjective:  Chief Complaint and HPI: Connie Bryant is a 55 y.o. female here today for intermittent nose bleeds that last 5-10 mins.  usu a cold rag to the back of her neck helps.  She has had this a long time ago.  She has been receiving meds elsewhere.  She checks her blood pressure daily and it is usu 130/low 80s.  Compliant with meds.    ROS:   Constitutional:  No f/c, No night sweats, No unexplained weight loss. EENT:  No vision changes, No blurry vision, No hearing changes. No mouth, throat, or ear problems.  Respiratory: No cough, No SOB Cardiac: No CP, no palpitations GI:  No abd pain, No N/V/D. GU: No Urinary s/sx Musculoskeletal: No joint pain Neuro: No headache, no dizziness, no motor weakness.  Skin: No rash Endocrine:  No polydipsia. No polyuria.  Psych: Denies SI/HI  No problems updated.  ALLERGIES: Allergies  Allergen Reactions  . Lisinopril Cough  . Oxycodone-Acetaminophen Hives  . Tylox [Oxycodone-Acetaminophen] Hives    PAST MEDICAL HISTORY: Past Medical History:  Diagnosis Date  . Anemia   . Glaucoma   . Hypertension   . Obesity   . Pneumonia    2008    MEDICATIONS AT HOME: Prior to Admission medications   Medication Sig Start Date End Date Taking? Authorizing Provider  ibuprofen (ADVIL,MOTRIN) 600 MG tablet Take 1 tablet (600 mg total) by mouth every 8 (eight) hours as needed for moderate pain or cramping. Take with food. 02/07/18  Yes Gildardo Pounds, NP  amLODipine (NORVASC) 10 MG tablet Take 1 tablet (10 mg total) by mouth daily. 09/19/20   Argentina Donovan, PA-C  atorvastatin (LIPITOR) 20 MG tablet Take 1 tablet (20 mg total) by mouth daily. 09/19/20   Argentina Donovan, PA-C  cloNIDine (CATAPRES) 0.1 MG tablet Take 1 tablet (0.1 mg total) by mouth 2 (two) times daily.  09/19/20   Argentina Donovan, PA-C  Vitamin D, Ergocalciferol, (DRISDOL) 50000 units CAPS capsule Take 1 capsule (50,000 Units total) by mouth every 7 (seven) days. Patient not taking: Reported on 02/07/2018 09/27/17   Gildardo Pounds, NP     Objective:  EXAM:   Vitals:   09/19/20 1543  BP: (!) 142/82  Pulse: 82  Resp: 18  SpO2: 100%  Weight: 224 lb (101.6 kg)  Height: 5\' 8"  (1.727 m)    General appearance : A&OX3. NAD. Non-toxic-appearing HEENT: Atraumatic and Normocephalic.  PERRLA. EOM intact.  Turbinates enlarged and irritated looking.  No obvious source of the bleeding. Neck: supple, no JVD. No cervical lymphadenopathy. No thyromegaly Chest/Lungs:  Breathing-non-labored, Good air entry bilaterally, breath sounds normal without rales, rhonchi, or wheezing  CVS: S1 S2 regular, no murmurs, gallops, rubs  Extremities: Bilateral Lower Ext shows no edema, both legs are warm to touch with = pulse throughout Neurology:  CN II-XII grossly intact, Non focal.   Psych:  TP linear. J/I WNL. Normal speech. Appropriate eye contact and affect.  Skin:  No Rash  Data Review Lab Results  Component Value Date   HGBA1C 5.5 04/28/2016   HGBA1C 5.2 04/25/2013     Assessment & Plan   1. Mixed hyperlipidemia - atorvastatin (LIPITOR) 20 MG tablet; Take 1 tablet (20 mg total) by mouth daily.  Dispense:  90 tablet; Refill: 0  2. Essential hypertension Controlled based on home numbers - amLODipine (NORVASC) 10 MG tablet; Take 1 tablet (10 mg total) by mouth daily.  Dispense: 90 tablet; Refill: 0 - cloNIDine (CATAPRES) 0.1 MG tablet; Take 1 tablet (0.1 mg total) by mouth 2 (two) times daily.  Dispense: 180 tablet; Refill: 0 - Comprehensive metabolic panel  3. Bleeding nose Patient education discussed.  She says flonase worsens it.  Saline nasal spray - Comprehensive metabolic panel - Ambulatory referral to ENT - CBC with Differential/Platelet  4. Vitamin D deficiency - Vitamin D,  25-hydroxy  5. Screening for diabetes mellitus I have had a lengthy discussion and provided education about insulin resistance and the intake of too much sugar/refined carbohydrates.  I have advised the patient to work at a goal of eliminating sugary drinks, candy, desserts, sweets, refined sugars, processed foods, and white carbohydrates.  The patient expresses understanding.  - Hemoglobin A1c     Patient have been counseled extensively about nutrition and exercise  Return in about 3 months (around 12/20/2020) for with Zelda for chronic conditions.  The patient was given clear instructions to go to ER or return to medical center if symptoms don't improve, worsen or new problems develop. The patient verbalized understanding. The patient was told to call to get lab results if they haven't heard anything in the next week.     Freeman Caldron, PA-C [facility] and [facility] [place], [place]   09/19/2020, 4:03 PM

## 2020-09-19 NOTE — Patient Instructions (Signed)
Nosebleed, Adult A nosebleed is when blood comes out of the nose. Nosebleeds are common and can be caused by many things. They are usually not a sign of a serious medical problem. Follow these instructions at home: When you have a nosebleed:  Sit down.  Tilt your head a little forward.  Follow these steps: 1. Pinch your nose with a clean towel or tissue. 2. Keep pinching your nose for 5 minutes. Do not let go. 3. After 5 minutes, let go of your nose. 4. If there is still bleeding, do these steps again. Keep doing these steps until the bleeding stops.  Do not put tissues or other things in your nose to stop the bleeding.  Avoid lying down or putting your head back.  Use a nose spray decongestant as told by your doctor.   After a nosebleed:  Try not to blow your nose or sniffle for several hours.  Try not to strain, lift, or bend at the waist for several days.  Aspirin and blood-thinning medicines make bleeding more likely. If you take these medicines: ? Ask your doctor if you should stop taking them or if you should change how much you take. ? Do not stop taking the medicine unless your doctor tells you to.  If your nosebleed was caused by dryness, use over-the-counter saline nasal spray or gel and humidifier as told by your doctor. This will keep the inside of your nose moist and allow it to heal. If you need to use one of these products: ? Choose one that is water-soluble. ? Use only as much as you need and use it only as often as needed. ? Do not lie down right away after you use it.  If you get nosebleeds often, talk with your doctor about treatments. These may include: ? Nasal cautery. A chemical swab or electrical device is used to lightly burn tiny blood vessels inside the nose. This helps stop or prevent nosebleeds. ? Nasal packing. A gauze or other material is placed in the nose to keep constant pressure on the bleeding area. Contact a doctor if:  You have a  fever.  You get nosebleeds often.  You are getting nosebleeds more often than usual.  You bruise very easily.  You have something stuck in your nose.  You have bleeding in your mouth.  You vomit or cough up brown material.  You get a nosebleed after you start a new medicine. Get help right away if:  You have a nosebleed after you fall or hurt your head.  Your nosebleed does not go away after 20 minutes.  You feel dizzy or weak.  You have unusual bleeding from other parts of your body.  You have unusual bruising on other parts of your body.  You get sweaty.  You vomit blood. Summary  Nosebleeds are common. They are usually not a sign of a serious medical problem.  When you have a nosebleed, sit down and tilt your head a little forward. Pinch your nose with a clean tissue for 5 minutes.  Use saline spray or saline gel and a humidifier as told by your doctor.  Get help right away if your nosebleed does not go away after 20 minutes. This information is not intended to replace advice given to you by your health care provider. Make sure you discuss any questions you have with your health care provider. Document Revised: 03/02/2019 Document Reviewed: 03/02/2019 Elsevier Patient Education  2021 Elsevier Inc.  

## 2020-09-20 LAB — CBC WITH DIFFERENTIAL/PLATELET
Basophils Absolute: 0.1 10*3/uL (ref 0.0–0.2)
Basos: 1 %
EOS (ABSOLUTE): 0.2 10*3/uL (ref 0.0–0.4)
Eos: 3 %
Hematocrit: 40.5 % (ref 34.0–46.6)
Hemoglobin: 13.1 g/dL (ref 11.1–15.9)
Immature Grans (Abs): 0 10*3/uL (ref 0.0–0.1)
Immature Granulocytes: 0 %
Lymphocytes Absolute: 1.5 10*3/uL (ref 0.7–3.1)
Lymphs: 22 %
MCH: 27.7 pg (ref 26.6–33.0)
MCHC: 32.3 g/dL (ref 31.5–35.7)
MCV: 86 fL (ref 79–97)
Monocytes Absolute: 0.7 10*3/uL (ref 0.1–0.9)
Monocytes: 11 %
Neutrophils Absolute: 4 10*3/uL (ref 1.4–7.0)
Neutrophils: 63 %
Platelets: 262 10*3/uL (ref 150–450)
RBC: 4.73 x10E6/uL (ref 3.77–5.28)
RDW: 13.1 % (ref 11.7–15.4)
WBC: 6.5 10*3/uL (ref 3.4–10.8)

## 2020-09-20 LAB — COMPREHENSIVE METABOLIC PANEL
ALT: 14 IU/L (ref 0–32)
AST: 11 IU/L (ref 0–40)
Albumin/Globulin Ratio: 1.3 (ref 1.2–2.2)
Albumin: 4.4 g/dL (ref 3.8–4.9)
Alkaline Phosphatase: 101 IU/L (ref 44–121)
BUN/Creatinine Ratio: 11 (ref 9–23)
BUN: 8 mg/dL (ref 6–24)
Bilirubin Total: 0.3 mg/dL (ref 0.0–1.2)
CO2: 24 mmol/L (ref 20–29)
Calcium: 10 mg/dL (ref 8.7–10.2)
Chloride: 103 mmol/L (ref 96–106)
Creatinine, Ser: 0.75 mg/dL (ref 0.57–1.00)
Globulin, Total: 3.4 g/dL (ref 1.5–4.5)
Glucose: 103 mg/dL — ABNORMAL HIGH (ref 65–99)
Potassium: 4 mmol/L (ref 3.5–5.2)
Sodium: 140 mmol/L (ref 134–144)
Total Protein: 7.8 g/dL (ref 6.0–8.5)
eGFR: 93 mL/min/{1.73_m2} (ref >59)

## 2020-09-20 LAB — VITAMIN D 25 HYDROXY (VIT D DEFICIENCY, FRACTURES): Vit D, 25-Hydroxy: 19.6 ng/mL — ABNORMAL LOW (ref 30.0–100.0)

## 2020-09-20 LAB — HEMOGLOBIN A1C
Est. average glucose Bld gHb Est-mCnc: 123 mg/dL
Hgb A1c MFr Bld: 5.9 % — ABNORMAL HIGH (ref 4.8–5.6)

## 2020-09-25 ENCOUNTER — Other Ambulatory Visit: Payer: Self-pay | Admitting: Physician Assistant

## 2020-09-25 MED ORDER — VITAMIN D (ERGOCALCIFEROL) 1.25 MG (50000 UNIT) PO CAPS: 50000.0000 [IU] | ORAL_CAPSULE | ORAL | 1 refills | Status: DC

## 2020-09-27 ENCOUNTER — Telehealth: Payer: Self-pay | Admitting: Nurse Practitioner

## 2020-09-27 NOTE — Telephone Encounter (Signed)
Copied from [place] [id number]. Topic: General - Other >> Sep 27, 2020  2:09 PM Alanda Slim E wrote: Reason for CRM: Pt would like a call to go over her lab results/ she received call from pharmacy about meds but never spoke with anyone about her results/ please advise

## 2020-09-27 NOTE — Telephone Encounter (Signed)
Pt was called and given lab results  

## 2020-10-03 NOTE — Progress Notes (Signed)
Clonidine twice a day. She needs to make an appointment to re establish care in August. I have not seen her in 2 years

## 2020-10-21 ENCOUNTER — Ambulatory Visit (INDEPENDENT_AMBULATORY_CARE_PROVIDER_SITE_OTHER): Payer: BC Managed Care – PPO | Admitting: Otolaryngology

## 2020-12-12 ENCOUNTER — Other Ambulatory Visit: Payer: Self-pay | Admitting: Physician Assistant

## 2020-12-12 DIAGNOSIS — E782 Mixed hyperlipidemia: Secondary | ICD-10-CM

## 2020-12-12 NOTE — Telephone Encounter (Signed)
Requested medications are due for refill today.  yesy  Requested medications are on the active medications list.  yes  Last refill. 09/19/2020  Future visit scheduled.   no  Notes to clinic.  Failed protocol due to overdue lab work.

## 2021-01-12 ENCOUNTER — Other Ambulatory Visit: Payer: Self-pay | Admitting: Family Medicine

## 2021-01-12 DIAGNOSIS — E782 Mixed hyperlipidemia: Secondary | ICD-10-CM

## 2021-01-12 NOTE — Telephone Encounter (Signed)
Requested medication (s) are due for refill today: yes  Requested medication (s) are on the active medication list: yes  Last refill:  12/12/20  Future visit scheduled: no  Notes to clinic:  overdue lab work   Requested Prescriptions  Pending Prescriptions Disp Refills   atorvastatin (LIPITOR) 20 MG tablet [Pharmacy Med Name: ATORVASTATIN 20 MG TABLET] 30 tablet 0    Sig: TAKE 1 TABLET BY MOUTH EVERY DAY     Cardiovascular:  Antilipid - Statins Failed - 01/12/2021  1:31 PM      Failed - Total Cholesterol in normal range and within 360 days    Cholesterol, Total  Date Value Ref Range Status  02/07/2018 125 100 - 199 mg/dL Final          Failed - LDL in normal range and within 360 days    LDL Calculated  Date Value Ref Range Status  02/07/2018 66 0 - 99 mg/dL Final          Failed - HDL in normal range and within 360 days    HDL  Date Value Ref Range Status  02/07/2018 46 >39 mg/dL Final          Failed - Triglycerides in normal range and within 360 days    Triglycerides  Date Value Ref Range Status  02/07/2018 63 0 - 149 mg/dL Final          Passed - Patient is not pregnant      Passed - Valid encounter within last 12 months    Recent Outpatient Visits           3 months ago Bleeding nose   [place], [place], [name]   2 years ago Uterine bleeding, dysfunctional   [place], [name], NP   3 years ago Essential hypertension   [place] Gildardo Pounds, NP   3 years ago Essential hypertension   [place], FNP   3 years ago Decreased GFR   [place], Maylon Peppers, [place]

## 2021-02-10 ENCOUNTER — Other Ambulatory Visit: Payer: Self-pay | Admitting: Family Medicine

## 2021-02-10 DIAGNOSIS — E782 Mixed hyperlipidemia: Secondary | ICD-10-CM

## 2021-02-10 NOTE — Telephone Encounter (Signed)
Requested medications are due for refill today.  yes  Requested medications are on the active medications list.  yes  Last refill. 01/15/2021  Future visit scheduled.   no  Notes to clinic.  Labs are expired.

## 2021-02-21 ENCOUNTER — Other Ambulatory Visit: Payer: Self-pay | Admitting: Family Medicine

## 2021-02-21 DIAGNOSIS — E782 Mixed hyperlipidemia: Secondary | ICD-10-CM

## 2021-02-21 NOTE — Telephone Encounter (Signed)
Requested medication (s) are due for refill today-no   Requested medication (s) are on the active medication list -yes  Future visit scheduled -no  Last refill: 02/10/21 #30  Notes to clinic: Pharmacy request 90 days supply- last fill-courtesy RF- labs fail protocol 02/07/18  Requested Prescriptions  Pending Prescriptions Disp Refills   atorvastatin (LIPITOR) 20 MG tablet [Pharmacy Med Name: ATORVASTATIN 20 MG TABLET] 90 tablet 1    Sig: TAKE 1 TABLET BY MOUTH EVERY DAY     Cardiovascular:  Antilipid - Statins Failed - 02/21/2021  9:15 AM      Failed - Total Cholesterol in normal range and within 360 days    Cholesterol, Total  Date Value Ref Range Status  02/07/2018 125 100 - 199 mg/dL Final          Failed - LDL in normal range and within 360 days    LDL Calculated  Date Value Ref Range Status  02/07/2018 66 0 - 99 mg/dL Final          Failed - HDL in normal range and within 360 days    HDL  Date Value Ref Range Status  02/07/2018 46 >39 mg/dL Final          Failed - Triglycerides in normal range and within 360 days    Triglycerides  Date Value Ref Range Status  02/07/2018 63 0 - 149 mg/dL Final          Passed - Patient is not pregnant      Passed - Valid encounter within last 12 months    Recent Outpatient Visits           5 months ago Bleeding nose   [place], [place], [name]   3 years ago Uterine bleeding, dysfunctional   [place], [name], NP   3 years ago Essential hypertension   [place] Gildardo Pounds, NP   3 years ago Essential hypertension   [place], [place], FNP   3 years ago Decreased GFR   [place], [place], [place]                 Requested Prescriptions  Pending Prescriptions Disp Refills   atorvastatin (LIPITOR) 20 MG tablet [Pharmacy  Med Name: ATORVASTATIN 20 MG TABLET] 90 tablet 1    Sig: TAKE 1 TABLET BY MOUTH EVERY DAY     Cardiovascular:  Antilipid - Statins Failed - 02/21/2021  9:15 AM      Failed - Total Cholesterol in normal range and within 360 days    Cholesterol, Total  Date Value Ref Range Status  02/07/2018 125 100 - 199 mg/dL Final          Failed - LDL in normal range and within 360 days    LDL Calculated  Date Value Ref Range Status  02/07/2018 66 0 - 99 mg/dL Final          Failed - HDL in normal range and within 360 days    HDL  Date Value Ref Range Status  02/07/2018 46 >39 mg/dL Final          Failed - Triglycerides in normal range and within 360 days    Triglycerides  Date Value Ref Range Status  02/07/2018 63 0 - 149 mg/dL Final  Passed - Patient is not pregnant      Passed - Valid encounter within last 12 months    Recent Outpatient Visits           5 months ago Bleeding nose   [place], [place], [name]   3 years ago Uterine bleeding, dysfunctional   [place], [name], NP   3 years ago Essential hypertension   [place] Gildardo Pounds, NP   3 years ago Essential hypertension   [place], FNP   3 years ago Decreased GFR   [place], Maylon Peppers, [place]

## 2021-03-27 ENCOUNTER — Other Ambulatory Visit: Payer: Self-pay

## 2021-03-27 ENCOUNTER — Telehealth (INDEPENDENT_AMBULATORY_CARE_PROVIDER_SITE_OTHER): Payer: Self-pay

## 2021-03-27 ENCOUNTER — Other Ambulatory Visit: Payer: Self-pay | Admitting: Nurse Practitioner

## 2021-03-27 DIAGNOSIS — E782 Mixed hyperlipidemia: Secondary | ICD-10-CM

## 2021-03-27 DIAGNOSIS — I10 Essential (primary) hypertension: Secondary | ICD-10-CM

## 2021-03-27 NOTE — Telephone Encounter (Signed)
Patient aware of Pharmacy cost savings options.

## 2021-03-27 NOTE — Telephone Encounter (Signed)
Copied from [place] [id number]. Topic: General - Other >> Mar 26, 2021  1:14 PM Yvette Rack wrote: Reason for CRM: Pt stated she is going through a divorce and no longer has insurance so she would like to know if she could get assistance with getting her medication.  Please advise if patient can apply for patient access.  Please advice patient on CAFA, OC, BC.

## 2021-03-28 ENCOUNTER — Other Ambulatory Visit: Payer: Self-pay

## 2021-03-28 ENCOUNTER — Telehealth: Payer: Self-pay

## 2021-03-28 NOTE — Telephone Encounter (Signed)
Pt stated she is going through a divorce and no longer has insurance so she would like to know if she could get assistance with getting her medication.  Ask patient if insurance is active, if not she may be able to use a one time free fill in our pharmacy.

## 2021-03-28 NOTE — Telephone Encounter (Signed)
Can you please reach out to pt?

## 2021-03-28 NOTE — Telephone Encounter (Signed)
Requested medications are on the active medication list yes   Last visit 09/19/20  Future visit scheduled no  Notes to clinic failed protocol on Lipitor due to labs from 2019, Vit D not delegated, already had curtesy refill on Catapress and Norvasc, no upcoming appt. No lab result for phosphate on record. Please assess.  Requested Prescriptions  Pending Prescriptions Disp Refills   amLODipine (NORVASC) 10 MG tablet 90 tablet 0    Sig: Take 1 tablet (10 mg total) by mouth daily.     Cardiovascular:  Calcium Channel Blockers Failed - 03/27/2021  4:34 PM      Failed - Last BP in normal range    BP Readings from Last 1 Encounters:  09/19/20 (!) 142/82          Failed - Valid encounter within last 6 months    Recent Outpatient Visits           6 months ago Bleeding nose   [place], [place], [name]   3 years ago Uterine bleeding, dysfunctional   [place], [place] W, NP   3 years ago Essential hypertension   [place] Gildardo Pounds, NP   3 years ago Essential hypertension   [place], [place], FNP   3 years ago Decreased GFR   [place], [place] R, FNP               atorvastatin (LIPITOR) 20 MG tablet 30 tablet 0    Sig: TAKE 1 TABLET BY MOUTH EVERY DAY     Cardiovascular:  Antilipid - Statins Failed - 03/27/2021  4:34 PM      Failed - Total Cholesterol in normal range and within 360 days    Cholesterol, Total  Date Value Ref Range Status  02/07/2018 125 100 - 199 mg/dL Final          Failed - LDL in normal range and within 360 days    LDL Calculated  Date Value Ref Range Status  02/07/2018 66 0 - 99 mg/dL Final          Failed - HDL in normal range and within 360 days    HDL  Date Value Ref Range Status  02/07/2018 46 >39 mg/dL Final          Failed -  Triglycerides in normal range and within 360 days    Triglycerides  Date Value Ref Range Status  02/07/2018 63 0 - 149 mg/dL Final          Passed - Patient is not pregnant      Passed - Valid encounter within last 12 months    Recent Outpatient Visits           6 months ago Bleeding nose   [place], [place], [name]   3 years ago Uterine bleeding, dysfunctional   [place], [name], NP   3 years ago Essential hypertension   [place] Gildardo Pounds, NP   3 years ago Essential hypertension   [place], FNP   3 years ago Decreased GFR   [place], Maylon Peppers, [place]               Vitamin  D, Ergocalciferol, (DRISDOL) 1.25 MG (50000 UNIT) CAPS capsule 12 capsule 1    Sig: Take 1 capsule (50,000 Units total) by mouth every 7 (seven) days.     Endocrinology:  Vitamins - Vitamin D Supplementation Failed - 03/27/2021  4:34 PM      Failed - 50,000 IU strengths are not delegated      Failed - Phosphate in normal range and within 360 days    No results found for: PHOS        Failed - Vitamin D in normal range and within 360 days    Vit D, 25-Hydroxy  Date Value Ref Range Status  09/19/2020 19.6 (L) 30.0 - 100.0 ng/mL Final    Comment:    Vitamin D deficiency has been defined by the Institute of Medicine and an Endocrine Society practice guideline as a level of serum 25-OH vitamin D less than 20 ng/mL (1,2). The Endocrine Society went on to further define vitamin D insufficiency as a level between 21 and 29 ng/mL (2). 1. IOM (Institute of Medicine). 2010. Dietary reference    intakes for calcium and D. [place]: The    [employer]. 2. Holick MF, Binkley Hearne, Bischoff-Ferrari HA, et al.    Evaluation, treatment, and prevention of vitamin D    deficiency: an  Endocrine Society clinical practice    guideline. JCEM. 2011 Jul; 96(7):1911-30.           Passed - Ca in normal range and within 360 days    Calcium  Date Value Ref Range Status  09/19/2020 10.0 8.7 - 10.2 mg/dL Final          Passed - Valid encounter within last 12 months    Recent Outpatient Visits           6 months ago Bleeding nose   [place], [place], [name]   3 years ago Uterine bleeding, dysfunctional   [place], [name], NP   3 years ago Essential hypertension   [place] Gildardo Pounds, NP   3 years ago Essential hypertension   [place], FNP   3 years ago Decreased GFR   [place], [place] R,                cloNIDine (CATAPRES) 0.1 MG tablet 180 tablet 0    Sig: Take 1 tablet (0.1 mg total) by mouth 2 (two) times daily.     Cardiovascular:  Alpha-2 Agonists Failed - 03/27/2021  4:34 PM      Failed - Last BP in normal range    BP Readings from Last 1 Encounters:  09/19/20 (!) 142/82          Failed - Valid encounter within last 6 months    Recent Outpatient Visits           6 months ago Bleeding nose   [place], [place], [name]   3 years ago Uterine bleeding, dysfunctional   [place], [name], NP   3 years ago Essential hypertension   [place] Gildardo Pounds, NP   3 years ago Essential hypertension   [place], FNP   3 years ago Decreased GFR   [facility] [place], [place] R, [place]              Passed - Last Heart Rate in normal range    Pulse Readings from Last 1 Encounters:  09/19/20 82

## 2021-03-31 ENCOUNTER — Telehealth: Payer: Self-pay | Admitting: Nurse Practitioner

## 2021-03-31 NOTE — Telephone Encounter (Signed)
I call the Pt, spoke to her we are going to try to get the Rx card for now since she still dealing with the problem at home

## 2021-04-01 ENCOUNTER — Other Ambulatory Visit: Payer: Self-pay

## 2021-04-01 MED ORDER — AMLODIPINE BESYLATE 10 MG PO TABS
ORAL_TABLET | ORAL | 1 refills | Status: DC
  Filled 2021-04-01: qty 90, 90d supply, fill #0

## 2021-04-01 MED ORDER — CLONIDINE HCL 0.1 MG PO TABS
ORAL_TABLET | ORAL | 1 refills | Status: DC
  Filled 2021-04-01: qty 60, 30d supply, fill #0

## 2021-04-01 MED FILL — Atorvastatin Calcium Tab 20 MG (Base Equivalent): ORAL | 30 days supply | Qty: 30 | Fill #0 | Status: AC

## 2021-04-02 ENCOUNTER — Other Ambulatory Visit: Payer: Self-pay

## 2021-04-04 IMAGING — MG DIGITAL SCREENING BILAT W/ TOMO W/ CAD
6 of 12 series · 6 of 36 positions shown · non-contrast
Comparison: Previous exam(s).

CLINICAL DATA: Screening.

EXAM:
DIGITAL SCREENING BILATERAL MAMMOGRAM WITH TOMO AND CAD

[L MLO synth-2D (1 of 2)]
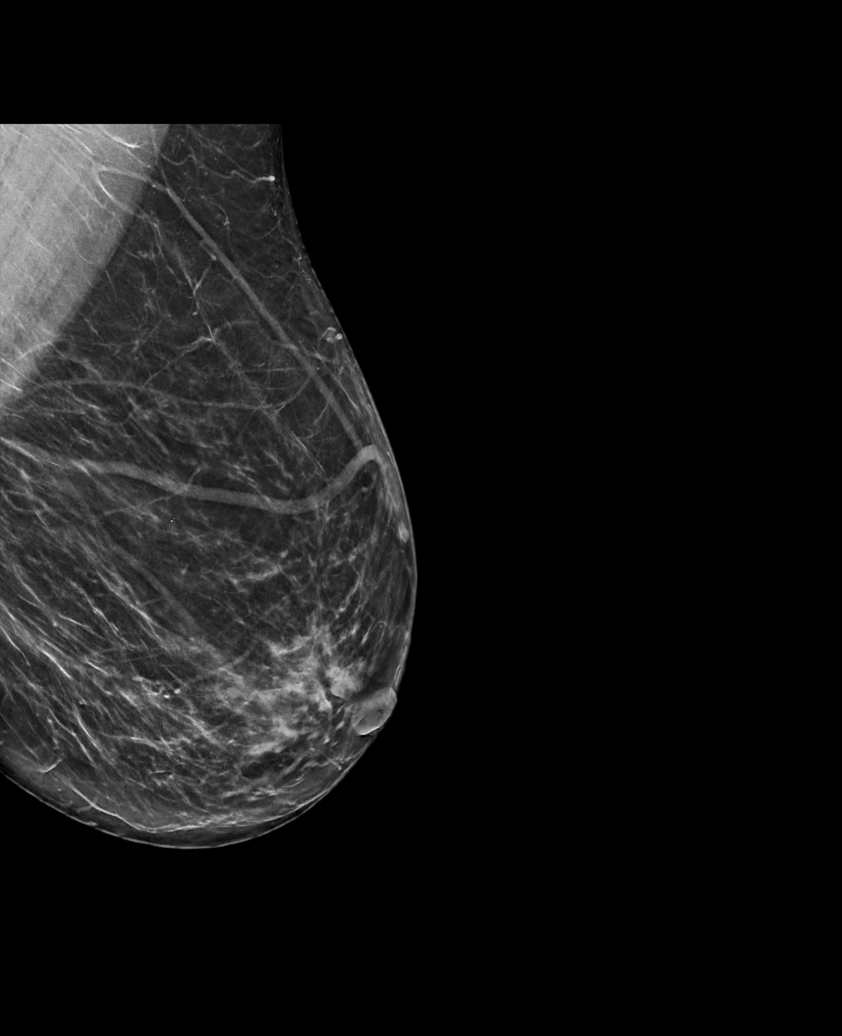

[R CC synth-2D]
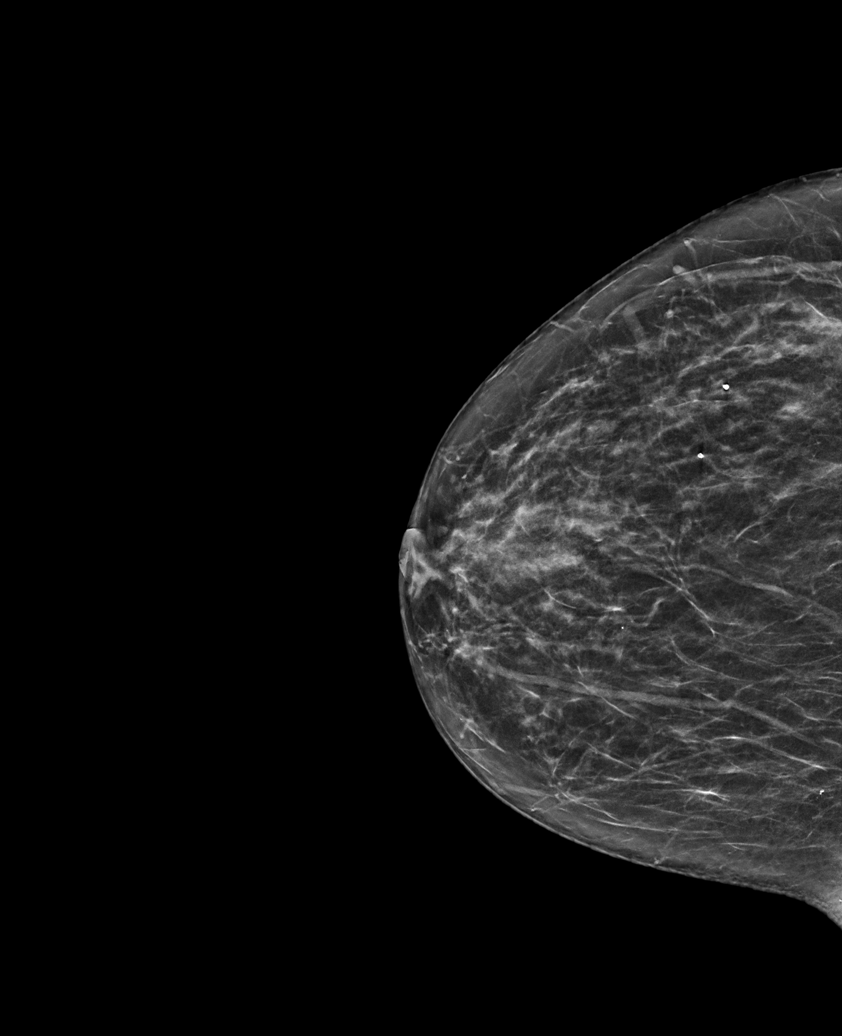

[L CC synth-2D]
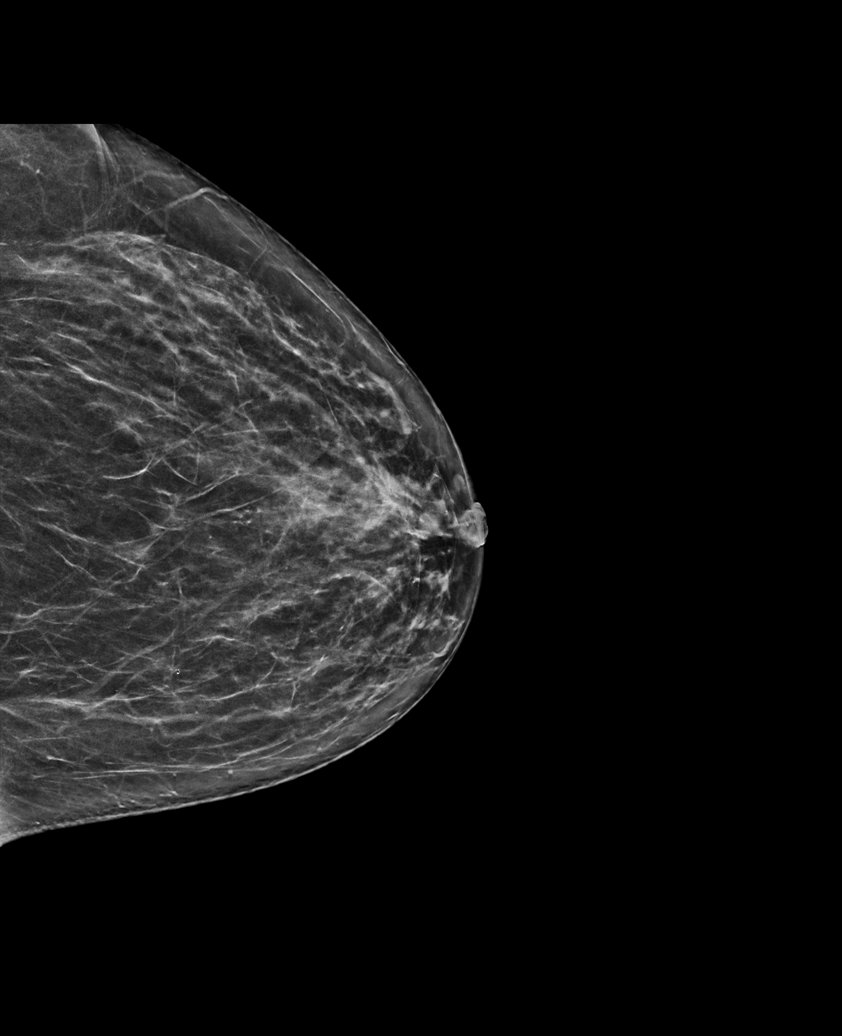

[R MLO synth-2D (1 of 2)]
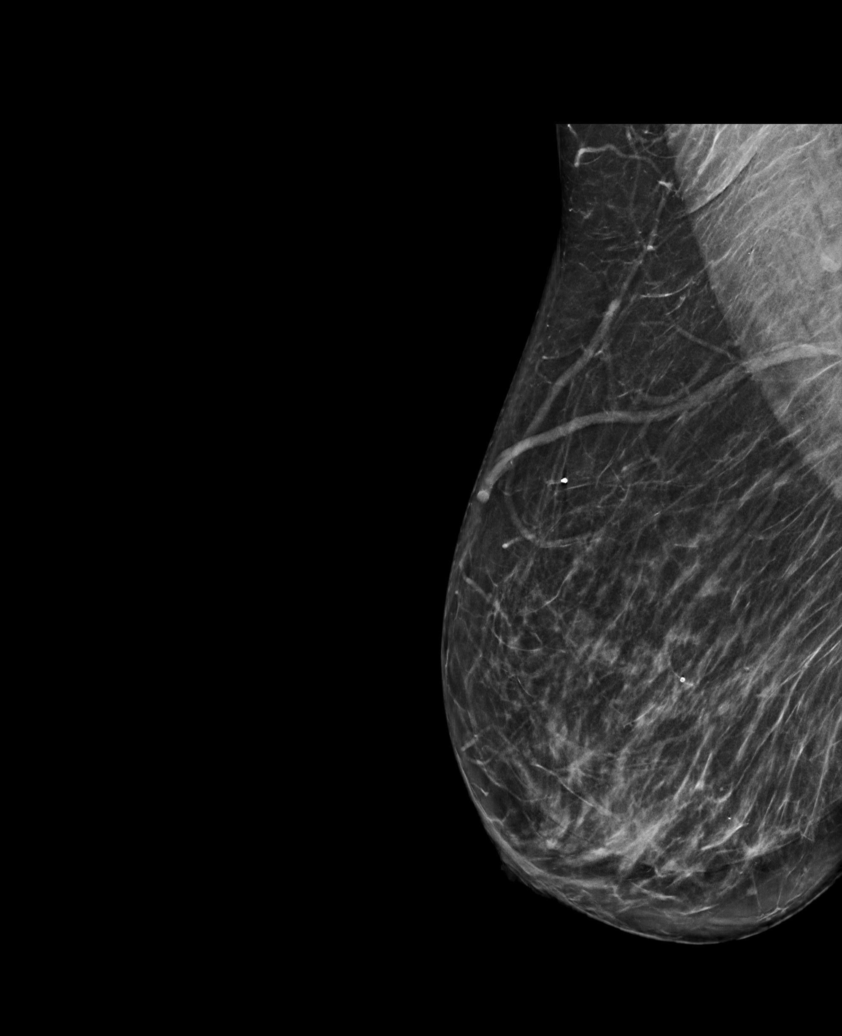

[L MLO synth-2D (2 of 2)]
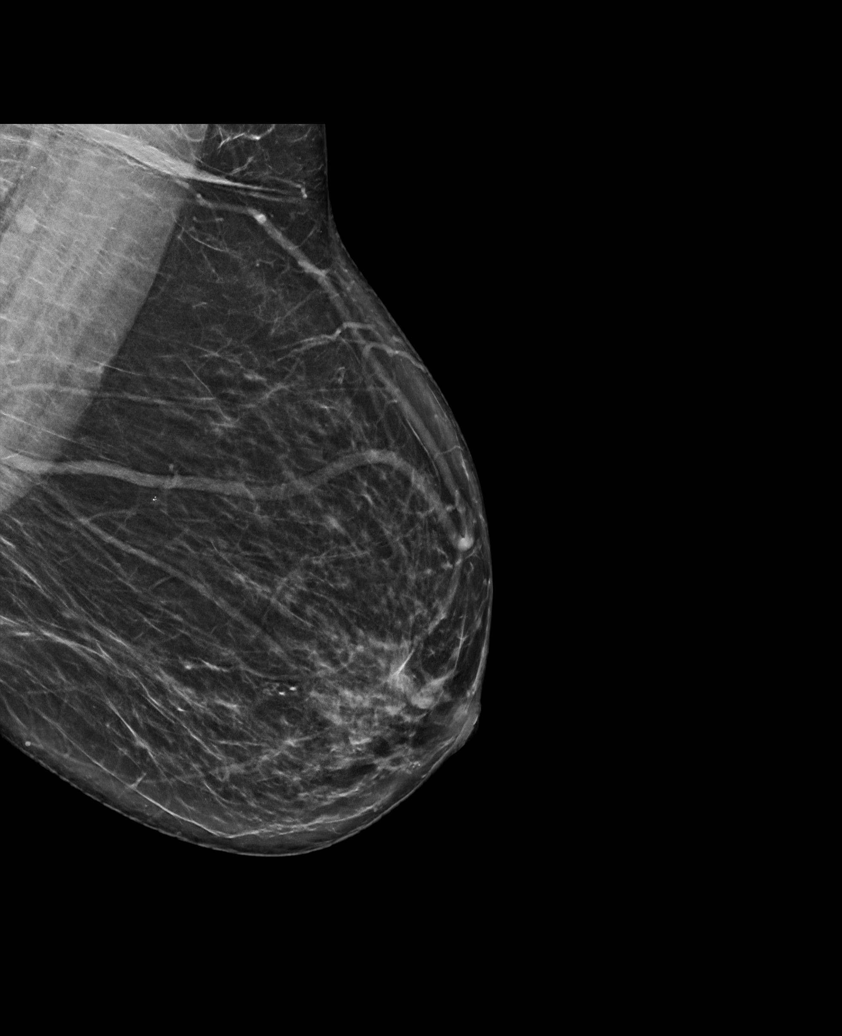

[R MLO synth-2D (2 of 2)]
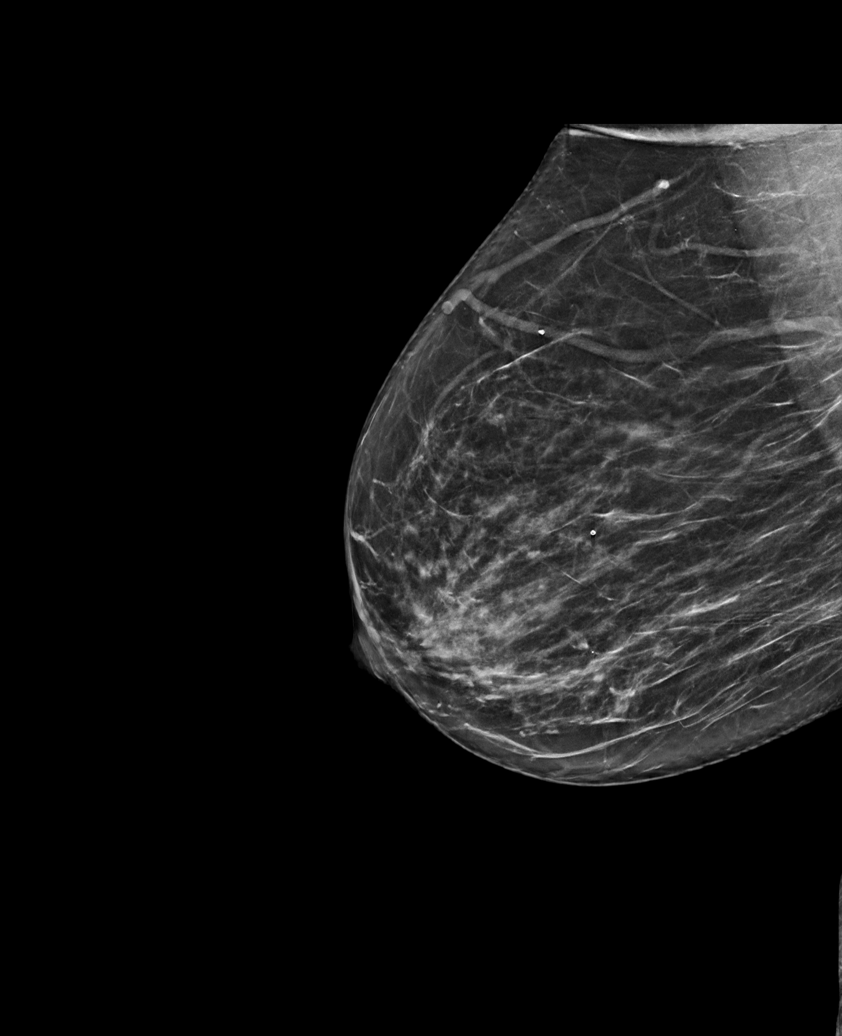

[6 of 36 positions shown; findings below may reference images not displayed]

ACR Breast Density Category c: The breast tissue is heterogeneously
dense, which may obscure small masses.
FINDINGS: There are no findings suspicious for malignancy. Images were
processed with CAD.
IMPRESSION: No mammographic evidence of malignancy. A result letter of this
screening mammogram will be mailed directly to the patient.

RECOMMENDATION:
Screening mammogram in one year. (Code:[id number])

BI-RADS CATEGORY  1: Negative.

## 2021-04-16 ENCOUNTER — Ambulatory Visit: Payer: Self-pay | Admitting: Physician Assistant

## 2021-04-17 ENCOUNTER — Ambulatory Visit: Payer: Self-pay | Attending: Physician Assistant | Admitting: Physician Assistant

## 2021-04-17 ENCOUNTER — Other Ambulatory Visit: Payer: Self-pay

## 2021-04-17 VITALS — BP 129/84 | HR 64 | Temp 98.9°F | Resp 18 | Ht 70.0 in | Wt 223.0 lb

## 2021-04-17 DIAGNOSIS — M25511 Pain in right shoulder: Secondary | ICD-10-CM

## 2021-04-17 DIAGNOSIS — I10 Essential (primary) hypertension: Secondary | ICD-10-CM

## 2021-04-17 DIAGNOSIS — E559 Vitamin D deficiency, unspecified: Secondary | ICD-10-CM

## 2021-04-17 DIAGNOSIS — E782 Mixed hyperlipidemia: Secondary | ICD-10-CM

## 2021-04-17 DIAGNOSIS — Z1159 Encounter for screening for other viral diseases: Secondary | ICD-10-CM

## 2021-04-17 DIAGNOSIS — G8929 Other chronic pain: Secondary | ICD-10-CM

## 2021-04-17 DIAGNOSIS — E6609 Other obesity due to excess calories: Secondary | ICD-10-CM

## 2021-04-17 DIAGNOSIS — F439 Reaction to severe stress, unspecified: Secondary | ICD-10-CM

## 2021-04-17 DIAGNOSIS — M25512 Pain in left shoulder: Secondary | ICD-10-CM

## 2021-04-17 DIAGNOSIS — Z6832 Body mass index (BMI) 32.0-32.9, adult: Secondary | ICD-10-CM

## 2021-04-17 MED ORDER — AMLODIPINE BESYLATE 10 MG PO TABS
10.0000 mg | ORAL_TABLET | Freq: Every day | ORAL | 0 refills | Status: DC
  Filled 2021-04-17: qty 90, 90d supply, fill #0
  Filled 2021-06-27 – 2021-06-30 (×2): qty 30, 30d supply, fill #0

## 2021-04-17 MED ORDER — HYDROXYZINE HCL 25 MG PO TABS
25.0000 mg | ORAL_TABLET | Freq: Every evening | ORAL | 0 refills | Status: DC | PRN
  Filled 2021-04-17: qty 30, 30d supply, fill #0

## 2021-04-17 MED ORDER — CLONIDINE HCL 0.1 MG PO TABS
0.1000 mg | ORAL_TABLET | Freq: Two times a day (BID) | ORAL | 0 refills | Status: DC
  Filled 2021-04-17 – 2021-06-05 (×2): qty 180, 90d supply, fill #0
  Filled 2021-06-12: qty 60, 30d supply, fill #0
  Filled 2021-07-11: qty 60, 30d supply, fill #1
  Filled 2021-08-08: qty 60, 30d supply, fill #2

## 2021-04-17 MED ORDER — IBUPROFEN 600 MG PO TABS
600.0000 mg | ORAL_TABLET | Freq: Three times a day (TID) | ORAL | 1 refills | Status: DC | PRN
  Filled 2021-04-17 – 2021-12-04 (×2): qty 60, 20d supply, fill #0

## 2021-04-17 MED ORDER — ATORVASTATIN CALCIUM 20 MG PO TABS
ORAL_TABLET | ORAL | 0 refills | Status: DC
  Filled 2021-04-17: qty 90, fill #0
  Filled 2021-05-02 – 2021-06-12 (×3): qty 30, 30d supply, fill #0
  Filled 2021-07-11: qty 30, 30d supply, fill #1

## 2021-04-17 NOTE — Progress Notes (Signed)
Established Patient Office Visit  Subjective:  Patient ID: Connie Bryant, female    DOB: 1966-01-05  Age: 55 y.o. MRN: [id number]  CC:  Chief Complaint  Patient presents with   Hypertension     HPI Jrue Yambao Bracy request medication refills.  Reports that she has not been checking her blood pressure at home, denies any hypertensive symptoms.  Reports that she has been having increased life stressors, is currently going through a divorce.  Does endorse support system  Reports that she does have difficulty falling asleep due to her current life stressors, states when she is asleep she is able to fall asleep but may take up to 3 to 4 hours to fall asleep.  No other concerns at this time    Past Medical History:  Diagnosis Date   Anemia    Glaucoma    Hypertension    Obesity    Pneumonia    2008    Past Surgical History:  Procedure Laterality Date   TUBAL LIGATION      Family History  Problem Relation Age of Onset   Diabetes Brother    Heart disease Brother    Heart attack Brother    Diabetes Mother    Kidney disease Mother    Heart attack Mother    Diabetes Sister    Heart attack Sister    Breast cancer Cousin     Social History   Socioeconomic History   Marital status: Married    Spouse name: Not on file   Number of children: Not on file   Years of education: Not on file   Highest education level: Not on file  Occupational History   Not on file  Tobacco Use   Smoking status: Former    Types: Cigarettes    Quit date: 06/18/2014    Years since quitting: 6.8   Smokeless tobacco: Never  Vaping Use   Vaping Use: Never used  Substance and Sexual Activity   Alcohol use: Yes    Alcohol/week: 1.0 standard drink    Types: 1 Cans of beer per week    Comment: occasionally    Drug use: No   Sexual activity: Yes    Birth control/protection: Surgical  Other Topics Concern   Not on file  Social History Narrative   ** Merged History Encounter **        Social Determinants of Health   Financial Resource Strain: Not on file  Food Insecurity: Not on file  Transportation Needs: Not on file  Physical Activity: Not on file  Stress: Not on file  Social Connections: Not on file  Intimate Partner Violence: Not on file    Outpatient Medications Prior to Visit  Medication Sig Dispense Refill   amLODipine (NORVASC) 10 MG tablet Take 1 tablet (10 mg total) by mouth daily. 90 tablet 0   Vitamin D, Ergocalciferol, (DRISDOL) 1.25 MG (50000 UNIT) CAPS capsule Take 1 capsule (50,000 Units total) by mouth every 7 (seven) days. 12 capsule 1   amLODipine (NORVASC) 10 MG tablet Take 1 tablet by mouth daily. 90 tablet 1   atorvastatin (LIPITOR) 20 MG tablet TAKE 1 TABLET BY MOUTH EVERY DAY 30 tablet 0   cloNIDine (CATAPRES) 0.1 MG tablet Take 1 tablet (0.1 mg total) by mouth 2 (two) times daily. 180 tablet 0   cloNIDine (CATAPRES) 0.1 MG tablet Take 1 tablet by mouth twice daily. 180 tablet 1   ibuprofen (ADVIL,MOTRIN) 600 MG tablet Take 1 tablet (  600 mg total) by mouth every 8 (eight) hours as needed for moderate pain or cramping. Take with food. 60 tablet 1   No facility-administered medications prior to visit.    Allergies  Allergen Reactions   Lisinopril Cough   Oxycodone-Acetaminophen Hives   Tylox [Oxycodone-Acetaminophen] Hives    ROS Review of Systems  Constitutional: Negative.   HENT: Negative.    Eyes: Negative.   Respiratory:  Negative for shortness of breath.   Cardiovascular:  Negative for chest pain.  Gastrointestinal: Negative.   Endocrine: Negative.   Genitourinary: Negative.   Musculoskeletal:  Positive for arthralgias.  Skin: Negative.   Allergic/Immunologic: Negative.   Neurological: Negative.   Hematological: Negative.   Psychiatric/Behavioral:  Positive for dysphoric mood and sleep disturbance. Negative for self-injury and suicidal ideas. The patient is nervous/anxious.      Objective:    Physical Exam Vitals  and nursing note reviewed.  Constitutional:      Appearance: Normal appearance. She is obese.  HENT:     Head: Normocephalic and atraumatic.     Right Ear: External ear normal.     Left Ear: External ear normal.     Nose: Nose normal.     Mouth/Throat:     Mouth: Mucous membranes are moist.     Pharynx: Oropharynx is clear.  Eyes:     Conjunctiva/sclera: Conjunctivae normal.     Pupils: Pupils are equal, round, and reactive to light.  Cardiovascular:     Rate and Rhythm: Normal rate and regular rhythm.     Pulses: Normal pulses.     Heart sounds: Normal heart sounds.  Pulmonary:     Effort: Pulmonary effort is normal.     Breath sounds: Normal breath sounds.  Musculoskeletal:        General: Normal range of motion.     Cervical back: Normal range of motion and neck supple.  Skin:    General: Skin is warm and dry.  Neurological:     General: No focal deficit present.     Mental Status: She is alert and oriented to person, place, and time.  Psychiatric:        Mood and Affect: Mood normal.        Behavior: Behavior normal.        Thought Content: Thought content normal.        Judgment: Judgment normal.    BP 129/84 (BP Location: Left Arm, Patient Position: Sitting, Cuff Size: Large)   Pulse 64   Temp 98.9 F (37.2 C) (Oral)   Resp 18   Ht 5' 10"  (1.778 m)   Wt 223 lb (101.2 kg)   SpO2 100%   BMI 32.00 kg/m  Wt Readings from Last 3 Encounters:  04/17/21 223 lb (101.2 kg)  09/19/20 224 lb (101.6 kg)  02/07/18 210 lb 9.6 oz (95.5 kg)     Health Maintenance Due  Topic Date Due   COVID-19 Vaccine (1) Never done   Hepatitis C Screening  Never done   Pneumococcal Vaccine 86-77 Years old (2 - PCV) 12/20/2006   COLONOSCOPY (Pts 45-62yr Insurance coverage will need to be confirmed)  Never done   Zoster Vaccines- Shingrix (1 of 2) Never done   PAP SMEAR-Modifier  09/04/2016   INFLUENZA VACCINE  12/16/2020    There are no preventive care reminders to display for  this patient.  Lab Results  Component Value Date   TSH 0.83 04/28/2016   Lab Results  Component Value Date  WBC 6.5 09/19/2020   HGB 13.1 09/19/2020   HCT 40.5 09/19/2020   MCV 86 09/19/2020   PLT 262 09/19/2020   Lab Results  Component Value Date   NA 140 09/19/2020   K 4.0 09/19/2020   CO2 24 09/19/2020   GLUCOSE 103 (H) 09/19/2020   BUN 8 09/19/2020   CREATININE 0.75 09/19/2020   BILITOT 0.3 09/19/2020   ALKPHOS 101 09/19/2020   AST 11 09/19/2020   ALT 14 09/19/2020   PROT 7.8 09/19/2020   ALBUMIN 4.4 09/19/2020   CALCIUM 10.0 09/19/2020   ANIONGAP 3 (L) 06/22/2014   EGFR 93 09/19/2020   Lab Results  Component Value Date   CHOL 125 02/07/2018   Lab Results  Component Value Date   HDL 46 02/07/2018   Lab Results  Component Value Date   LDLCALC 66 02/07/2018   Lab Results  Component Value Date   TRIG 63 02/07/2018   Lab Results  Component Value Date   CHOLHDL 2.7 02/07/2018   Lab Results  Component Value Date   HGBA1C 5.9 (H) 09/19/2020      Assessment & Plan:   Problem List Items Addressed This Visit       Cardiovascular and Mediastinum   Essential hypertension   Relevant Medications   amLODipine (NORVASC) 10 MG tablet   cloNIDine (CATAPRES) 0.1 MG tablet   atorvastatin (LIPITOR) 20 MG tablet   Other Visit Diagnoses     Mixed hyperlipidemia    -  Primary   Relevant Medications   amLODipine (NORVASC) 10 MG tablet   cloNIDine (CATAPRES) 0.1 MG tablet   atorvastatin (LIPITOR) 20 MG tablet   Other Relevant Orders   Lipid panel   Vitamin D deficiency       Relevant Orders   Vitamin D, 25-hydroxy   Stress       Relevant Medications   hydrOXYzine (ATARAX) 25 MG tablet   Acute pain of both shoulders       Relevant Medications   ibuprofen (ADVIL) 600 MG tablet   Encounter for HCV screening test for low risk patient       Relevant Orders   HCV Ab w Reflex to Quant PCR       Meds ordered this encounter  Medications   hydrOXYzine  (ATARAX) 25 MG tablet    Sig: Take 1 tablet (25 mg total) by mouth at bedtime as needed.    Dispense:  30 tablet    Refill:  0    Order Specific Question:   Supervising Provider    Answer:   Asencion Noble E [[id number]]   amLODipine (NORVASC) 10 MG tablet    Sig: Take 1 tablet (10 mg total) by mouth daily.    Dispense:  90 tablet    Refill:  0    Order Specific Question:   Supervising Provider    Answer:   Asencion Noble E [[id number]]   cloNIDine (CATAPRES) 0.1 MG tablet    Sig: Take 1 tablet (0.1 mg total) by mouth 2 (two) times daily.    Dispense:  180 tablet    Refill:  0    Order Specific Question:   Supervising Provider    Answer:   Asencion Noble E [[id number]]   ibuprofen (ADVIL) 600 MG tablet    Sig: Take 1 tablet (600 mg total) by mouth every 8 (eight) hours as needed for moderate pain or cramping. Take with food.    Dispense:  60 tablet    Refill:  1    Order Specific Question:   Supervising Provider    Answer:   Elsie Stain [[id number]]   atorvastatin (LIPITOR) 20 MG tablet    Sig: TAKE 1 TABLET BY MOUTH EVERY DAY    Dispense:  90 tablet    Refill:  0    Order Specific Question:   Supervising Provider    Answer:   WRIGHT, PATRICK E [[id number]]  1. Essential hypertension Continue current regimen.  Patient encouraged to check blood pressure at home, keep a written log and have available for all office visits.  Patient to return for fasting labs.  Red flags given for prompt reevaluation - amLODipine (NORVASC) 10 MG tablet; Take 1 tablet (10 mg total) by mouth daily.  Dispense: 90 tablet; Refill: 0 - cloNIDine (CATAPRES) 0.1 MG tablet; Take 1 tablet (0.1 mg total) by mouth 2 (two) times daily.  Dispense: 180 tablet; Refill: 0  2. Stress Patient education given on stress management.  Trial hydroxyzine as needed at bedtime.  Patient declines referral for CBT - hydrOXYzine (ATARAX) 25 MG tablet; Take 1 tablet (25 mg total) by mouth at bedtime as needed.  Dispense: 30 tablet; Refill: 0  3.  Mixed hyperlipidemia Continue current regimen - Lipid panel; Future - atorvastatin (LIPITOR) 20 MG tablet; TAKE 1 TABLET BY MOUTH EVERY DAY  Dispense: 90 tablet; Refill: 0  4. Vitamin D deficiency  - Vitamin D, 25-hydroxy; Future  5. Chronic pain of both shoulders Continue current regimen - ibuprofen (ADVIL) 600 MG tablet; Take 1 tablet (600 mg total) by mouth every 8 (eight) hours as needed for moderate pain or cramping. Take with food.  Dispense: 60 tablet; Refill: 1  6. Encounter for HCV screening test for low risk patient  - HCV Ab w Reflex to Quant PCR; Future  7. Class 1 obesity due to excess calories with serious comorbidity and body mass index (BMI) of 32.0 to 32.9 in adult    I have reviewed the patient's medical history (PMH, PSH, Social History, Family History, Medications, and allergies) , and have been updated if relevant. I spent 30 minutes reviewing chart and  face to face time with patient.     Follow-up: Return in about 3 months (around 07/16/2021) for with Zelda; needs appt for fasting labs in next week.    Loraine Grip Mayers, PA-C

## 2021-04-17 NOTE — Patient Instructions (Addendum)
To help with your stress, you can take hydroxyzine at bedtime as needed.  We call you with your lab results when they are available.  Kennieth Rad, PA-C Physician Assistant [facility] [email or web address]  Stress, Adult Stress is a normal reaction to life events. Stress is what you feel when life demands more than you are used to, or more than you think you can handle. Some stress can be useful, such as studying for a test or meeting a deadline at work. Stress that occurs too often or for too long can cause problems. Long-lasting stress is called chronic stress. Chronic stress can affect your emotional health and interfere with relationships and normal daily activities. Too much stress can weaken your body's defense system (immune system) and increase your risk for physical illness. If you already have a medical problem, stress can make it worse. What are the causes? All sorts of life events can cause stress. An event that causes stress for one person may not be stressful for someone else. Major life events, whether positive or negative, commonly cause stress. Examples include: Losing a job or starting a new job. Losing a loved one. Moving to a new town or home. Getting married or divorced. Having a baby. Getting injured or sick. Less obvious life events can also cause stress, especially if they occur day after day or in combination with each other. Examples include: Working long hours. Driving in traffic. Caring for children. Being in debt. Being in a difficult relationship. What are the signs or symptoms? Stress can cause emotional and physical symptoms and can lead to unhealthy behaviors. These include the following: Emotional symptoms Anxiety. This is feeling worried, afraid, on edge, overwhelmed, or out of control. Anger, including irritation or impatience. Depression. This is feeling sad, down, helpless, or  guilty. Trouble focusing, remembering, or making decisions. Physical symptoms Aches and pains. These may affect your head, neck, back, stomach, or other areas of your body. Tight muscles or a clenched jaw. Low energy. Trouble sleeping. Unhealthy behaviors Eating to feel better (overeating) or skipping meals. Working too much or putting off tasks. Smoking, drinking alcohol, or using drugs to feel better. How is this diagnosed? A stress disorder is diagnosed through an assessment by your health care provider. A stress disorder may be diagnosed based on: Your symptoms and any stressful life events. Your medical history. Tests to rule out other causes of your symptoms. Depending on your condition, your health care provider may refer you to a specialist for further evaluation. How is this treated? Stress management techniques are the recommended treatment for stress. Medicine is not typically recommended for treating stress. Techniques to reduce your reaction to stressful life events include: Identifying stress. Monitor yourself for symptoms of stress and notice what causes stress for you. These skills may help you to avoid or prepare for stressful events. Managing time. Set your priorities, keep a calendar of events, and learn to say no. These actions can help you avoid taking on too much. Techniques for dealing with stress include: Rethinking the problem. Try to think realistically about stressful events rather than ignoring them or overreacting. Try to find the positives in a stressful situation rather than focusing on the negatives. Exercise. Physical exercise can release both physical and emotional tension. The key is to find a form of exercise that you enjoy and do it regularly. Relaxation techniques. These relax the body and mind. Find one or more that you enjoy and use the techniques regularly.  Examples include: Meditation, deep breathing, or progressive relaxation techniques. Yoga or  tai chi. Biofeedback, mindfulness techniques, or journaling. Listening to music, being in nature, or taking part in other hobbies. Practicing a healthy lifestyle. Eat a balanced diet, drink plenty of water, limit or avoid caffeine, and get plenty of sleep. Having a strong support network. Spend time with family, friends, or other people you enjoy being around. Express your feelings and talk things over with someone you trust. Counseling or talk therapy with a mental health provider may help if you are having trouble managing stress by yourself. Follow these instructions at home: Lifestyle  Avoid drugs. Do not use any products that contain nicotine or tobacco. These products include cigarettes, chewing tobacco, and vaping devices, such as e-cigarettes. If you need help quitting, ask your health care provider. If you drink alcohol: Limit how much you have to: 0-1 drink a day for women who are not pregnant. 0-2 drinks a day for men. Know how much alcohol is in a drink. In the [place] one drink equals one 12 oz bottle of beer (355 mL), one 5 oz glass of wine (148 mL), or one 1 oz glass of hard liquor (44 mL). Do not use alcohol or drugs to relax. Eat a balanced diet that includes fresh fruits and vegetables, whole grains, lean meats, fish, eggs, beans, and low-fat dairy. Avoid processed foods and foods high in added fat, sugar, and salt. Exercise at least 30 minutes on 5 or more days each week. Get 7-8 hours of sleep each night. General instructions  Practice stress management techniques as told by your health care provider. Drink enough fluid to keep your urine pale yellow. Take over-the-counter and prescription medicines only as told by your health care provider. Keep all follow-up visits. This is important. Contact a health care provider if: Your symptoms get worse. You have new symptoms. You feel overwhelmed by your problems and can no longer manage them by yourself. Get help right away  if: You have thoughts of hurting yourself or others. Get help right awayif you feel like you may hurt yourself or others, or have thoughts about taking your own life. Go to your nearest emergency room or: Call 911. Call the [facility] at [phone] or 988. This is open 24 hours a day. Text the Crisis Text Line at 254 578 0318. Summary Stress is a normal reaction to life events. It can cause problems if it happens too often or for too long. Practicing stress management techniques is the best way to treat stress. Counseling or talk therapy with a mental health provider may help if you are having trouble managing stress by yourself. This information is not intended to replace advice given to you by your health care provider. Make sure you discuss any questions you have with your health care provider. Document Revised: 12/12/2020 Document Reviewed: 12/12/2020 Elsevier Patient Education  [place].

## 2021-04-17 NOTE — Progress Notes (Signed)
Patient took medication around 7am and second BP medication around 10 am. Patient denies pain at this time.

## 2021-04-19 ENCOUNTER — Encounter: Payer: Self-pay | Admitting: Physician Assistant

## 2021-04-19 DIAGNOSIS — F439 Reaction to severe stress, unspecified: Secondary | ICD-10-CM | POA: Insufficient documentation

## 2021-04-19 DIAGNOSIS — G8929 Other chronic pain: Secondary | ICD-10-CM | POA: Insufficient documentation

## 2021-04-19 DIAGNOSIS — E559 Vitamin D deficiency, unspecified: Secondary | ICD-10-CM | POA: Insufficient documentation

## 2021-04-19 DIAGNOSIS — M25512 Pain in left shoulder: Secondary | ICD-10-CM | POA: Insufficient documentation

## 2021-04-19 DIAGNOSIS — E782 Mixed hyperlipidemia: Secondary | ICD-10-CM | POA: Insufficient documentation

## 2021-04-24 ENCOUNTER — Ambulatory Visit: Payer: Self-pay

## 2021-04-24 ENCOUNTER — Ambulatory Visit: Payer: Self-pay | Attending: Family Medicine

## 2021-04-24 ENCOUNTER — Other Ambulatory Visit: Payer: Self-pay

## 2021-04-24 DIAGNOSIS — E559 Vitamin D deficiency, unspecified: Secondary | ICD-10-CM

## 2021-04-24 DIAGNOSIS — Z1159 Encounter for screening for other viral diseases: Secondary | ICD-10-CM

## 2021-04-24 DIAGNOSIS — E782 Mixed hyperlipidemia: Secondary | ICD-10-CM

## 2021-04-25 LAB — VITAMIN D 25 HYDROXY (VIT D DEFICIENCY, FRACTURES): Vit D, 25-Hydroxy: 55.1 ng/mL (ref 30.0–100.0)

## 2021-04-25 LAB — LIPID PANEL
Chol/HDL Ratio: 3.5 ratio (ref 0.0–4.4)
Cholesterol, Total: 141 mg/dL (ref 100–199)
HDL: 40 mg/dL (ref >39)
LDL Chol Calc (NIH): 84 mg/dL (ref 0–99)
Triglycerides: 88 mg/dL (ref 0–149)
VLDL Cholesterol Cal: 17 mg/dL (ref 5–40)

## 2021-04-25 LAB — HCV INTERPRETATION

## 2021-04-25 LAB — HCV AB W REFLEX TO QUANT PCR: HCV Ab: <0.1 s/co ratio (ref 0.0–0.9)

## 2021-05-01 ENCOUNTER — Telehealth: Payer: Self-pay | Admitting: *Deleted

## 2021-05-01 NOTE — Telephone Encounter (Signed)
MA UTR patient x2 and LVM to return a phone call to [phone].

## 2021-05-01 NOTE — Telephone Encounter (Signed)
-----   Message from Kennieth Rad, [name] sent at 04/28/2021 12:50 PM EST ----- Please call patient and let her know that her cholesterol is well controlled with her current medication, her vitamin D levels are within normal limits, her screening for hepatitis C was negative.

## 2021-05-02 ENCOUNTER — Other Ambulatory Visit: Payer: Self-pay

## 2021-05-29 ENCOUNTER — Telehealth: Payer: Self-pay | Admitting: Nurse Practitioner

## 2021-05-29 NOTE — Telephone Encounter (Signed)
Copied from [place] [phone]. Topic: General - Other >> May 29, 2021  9:11 AM Yvette Rack wrote: Reason for CRM: Pt request return call to schedule appt for orange card. Cb# [phone] I return Pt call, schedule a financial appt

## 2021-06-05 ENCOUNTER — Other Ambulatory Visit: Payer: Self-pay

## 2021-06-05 ENCOUNTER — Ambulatory Visit: Payer: Self-pay | Attending: Nurse Practitioner

## 2021-06-12 ENCOUNTER — Other Ambulatory Visit: Payer: Self-pay

## 2021-06-26 ENCOUNTER — Other Ambulatory Visit: Payer: Self-pay

## 2021-06-27 ENCOUNTER — Other Ambulatory Visit: Payer: Self-pay

## 2021-06-30 ENCOUNTER — Other Ambulatory Visit: Payer: Self-pay

## 2021-07-11 ENCOUNTER — Other Ambulatory Visit: Payer: Self-pay

## 2021-07-18 ENCOUNTER — Other Ambulatory Visit: Payer: Self-pay

## 2021-07-18 ENCOUNTER — Encounter: Payer: Self-pay | Admitting: Nurse Practitioner

## 2021-07-18 ENCOUNTER — Ambulatory Visit: Payer: Self-pay | Attending: Nurse Practitioner | Admitting: Nurse Practitioner

## 2021-07-18 VITALS — BP 118/82 | HR 70 | Resp 18 | Ht 70.0 in | Wt 218.0 lb

## 2021-07-18 DIAGNOSIS — F439 Reaction to severe stress, unspecified: Secondary | ICD-10-CM

## 2021-07-18 DIAGNOSIS — Z1211 Encounter for screening for malignant neoplasm of colon: Secondary | ICD-10-CM

## 2021-07-18 DIAGNOSIS — I10 Essential (primary) hypertension: Secondary | ICD-10-CM

## 2021-07-18 DIAGNOSIS — F419 Anxiety disorder, unspecified: Secondary | ICD-10-CM

## 2021-07-18 DIAGNOSIS — Z131 Encounter for screening for diabetes mellitus: Secondary | ICD-10-CM

## 2021-07-18 LAB — POCT GLYCOSYLATED HEMOGLOBIN (HGB A1C): Hemoglobin A1C: 5.9 % — AB (ref 4.0–5.6)

## 2021-07-18 LAB — GLUCOSE, POCT (MANUAL RESULT ENTRY): POC Glucose: 103 mg/dl — AB (ref 70–99)

## 2021-07-18 MED ORDER — HYDROXYZINE HCL 10 MG PO TABS
10.0000 mg | ORAL_TABLET | Freq: Every evening | ORAL | 3 refills | Status: AC | PRN
  Filled 2021-07-18 – 2021-07-31 (×2): qty 30, 30d supply, fill #0
  Filled 2021-08-25: qty 30, 30d supply, fill #1

## 2021-07-18 MED ORDER — AMLODIPINE BESYLATE 10 MG PO TABS
10.0000 mg | ORAL_TABLET | Freq: Every day | ORAL | 0 refills | Status: DC
  Filled 2021-07-18 – 2021-07-31 (×2): qty 90, 90d supply, fill #0

## 2021-07-18 NOTE — Patient Instructions (Signed)
[facility] with D ?

## 2021-07-18 NOTE — Progress Notes (Signed)
? ?Assessment & Plan:  ?Connie Bryant was seen today for hypertension and prediabetes. ? ?Diagnoses and all orders for this visit: ? ?Essential hypertension ?-     amLODipine (NORVASC) 10 MG tablet; Take 1 tablet (10 mg total) by mouth daily. ?-     CMP14+EGFR ?Continue all antihypertensives as prescribed.  ?Remember to bring in your blood pressure log with you for your follow up appointment.  ?DASH/Mediterranean Diets are healthier choices for HTN.   ? ?Screening for diabetes mellitus ?-     POCT glucose (manual entry) ?-     POCT glycosylated hemoglobin (Hb A1C) ? ?Colon cancer screening ?-     Fecal occult blood, imunochemical ? ?Anxiety ?-     hydrOXYzine (ATARAX) 10 MG tablet; Take 1 tablet (10 mg total) by mouth at bedtime as needed. ? ? ? ?Patient has been counseled on age-appropriate routine health concerns for screening and prevention. These are reviewed and up-to-date. Referrals have been placed accordingly. Immunizations are up-to-date or declined.    ?Subjective:  ? ?Chief Complaint  ?Patient presents with  ? Hypertension  ? Prediabetes  ? ?HPI ?Connie Bryant 56 y.o. female presents to office today for follow up to HTN ? ? ? ?Medication reaction ?States since she picked up her amlodipine from the pharmacy it is causing her mouth to feel like it's burning. I offered to start a different antihypertensive however she states she will finish the current bottle and try a new prescription for the amlodipine instead. She is also taking clonidine 0.1 mg BID. Blood pressure is well controlled. ?BP Readings from Last 3 Encounters:  ?07/18/21 118/82  ?04/17/21 129/84  ?09/19/20 (!) 142/82  ?  ? ?Anxiety  ?States hydroxyzine 25 mg makes her too drowsy. She would like to try a lower dose at this time.  ?Review of Systems  ?Constitutional:  Negative for fever, malaise/fatigue and weight loss.  ?HENT: Negative.  Negative for nosebleeds.   ?Eyes: Negative.  Negative for blurred vision, double vision and photophobia.   ?Respiratory: Negative.  Negative for cough and shortness of breath.   ?Cardiovascular: Negative.  Negative for chest pain, palpitations and leg swelling.  ?Gastrointestinal: Negative.  Negative for heartburn, nausea and vomiting.  ?Musculoskeletal: Negative.  Negative for myalgias.  ?Neurological: Negative.  Negative for dizziness, focal weakness, seizures and headaches.  ?Psychiatric/Behavioral:  Negative for suicidal ideas. The patient is nervous/anxious.   ? ?Past Medical History:  ?Diagnosis Date  ? Anemia   ? Glaucoma   ? Hypertension   ? Obesity   ? Pneumonia   ? 2008  ? ? ?Past Surgical History:  ?Procedure Laterality Date  ? TUBAL LIGATION    ? ? ?Family History  ?Problem Relation Age of Onset  ? Diabetes Brother   ? Heart disease Brother   ? Heart attack Brother   ? Diabetes Mother   ? Kidney disease Mother   ? Heart attack Mother   ? Diabetes Sister   ? Heart attack Sister   ? Breast cancer Cousin   ? ? ?Social History Reviewed with no changes to be made today.  ? ?Outpatient Medications Prior to Visit  ?Medication Sig Dispense Refill  ? atorvastatin (LIPITOR) 20 MG tablet TAKE 1 TABLET BY MOUTH EVERY DAY 90 tablet 0  ? cloNIDine (CATAPRES) 0.1 MG tablet Take 1 tablet (0.1 mg total) by mouth 2 (two) times daily. 180 tablet 0  ? ibuprofen (ADVIL) 600 MG tablet Take 1 tablet (600 mg total) by mouth  every 8 (eight) hours as needed for moderate pain or cramping. Take with food. 60 tablet 1  ? amLODipine (NORVASC) 10 MG tablet Take 1 tablet (10 mg total) by mouth daily. 90 tablet 0  ? hydrOXYzine (ATARAX) 25 MG tablet Take 1 tablet (25 mg total) by mouth at bedtime as needed. 30 tablet 0  ? Vitamin D, Ergocalciferol, (DRISDOL) 1.25 MG (50000 UNIT) CAPS capsule Take 1 capsule (50,000 Units total) by mouth every 7 (seven) days. (Patient not taking: Reported on 07/18/2021) 12 capsule 1  ? ?No facility-administered medications prior to visit.  ? ? ?Allergies  ?Allergen Reactions  ? Lisinopril Cough  ?  Oxycodone-Acetaminophen Hives  ? Tylox [Oxycodone-Acetaminophen] Hives  ? ? ?   ?Objective:  ?  ?BP 118/82   Pulse 70   Resp 18   Ht 5' 10"  (1.778 m)   Wt 218 lb (98.9 kg)   LMP  (LMP Unknown)   SpO2 100%   BMI 31.28 kg/m?  ?Wt Readings from Last 3 Encounters:  ?07/18/21 218 lb (98.9 kg)  ?04/17/21 223 lb (101.2 kg)  ?09/19/20 224 lb (101.6 kg)  ? ? ?Physical Exam ?Vitals and nursing note reviewed.  ?Constitutional:   ?   Appearance: She is well-developed.  ?HENT:  ?   Head: Normocephalic and atraumatic.  ?Cardiovascular:  ?   Rate and Rhythm: Normal rate and regular rhythm.  ?   Heart sounds: Normal heart sounds. No murmur heard. ?  No friction rub. No gallop.  ?Pulmonary:  ?   Effort: Pulmonary effort is normal. No tachypnea or respiratory distress.  ?   Breath sounds: Normal breath sounds. No decreased breath sounds, wheezing, rhonchi or rales.  ?Chest:  ?   Chest wall: No tenderness.  ?Abdominal:  ?   General: Bowel sounds are normal.  ?   Palpations: Abdomen is soft.  ?Musculoskeletal:     ?   General: Normal range of motion.  ?   Cervical back: Normal range of motion.  ?Skin: ?   General: Skin is warm and dry.  ?Neurological:  ?   Mental Status: She is alert and oriented to person, place, and time.  ?   Coordination: Coordination normal.  ?Psychiatric:     ?   Behavior: Behavior normal. Behavior is cooperative.     ?   Thought Content: Thought content normal.     ?   Judgment: Judgment normal.  ? ? ? ? ?   ?Patient has been counseled extensively about nutrition and exercise as well as the importance of adherence with medications and regular follow-up. The patient was given clear instructions to go to ER or return to medical center if symptoms don't improve, worsen or new problems develop. The patient verbalized understanding.  ? ?Follow-up: Return in about 3 months (around 10/18/2021).  ? ?Gildardo Pounds, FNP-BC ?[place] ?[place], [place] ?[phone]   ?07/20/2021,  10:06 PM ?

## 2021-07-20 ENCOUNTER — Encounter: Payer: Self-pay | Admitting: Nurse Practitioner

## 2021-07-25 ENCOUNTER — Other Ambulatory Visit: Payer: Self-pay

## 2021-07-31 ENCOUNTER — Ambulatory Visit: Payer: Self-pay | Attending: Nurse Practitioner

## 2021-07-31 ENCOUNTER — Other Ambulatory Visit: Payer: Self-pay

## 2021-08-01 LAB — CMP14+EGFR
ALT: 12 IU/L (ref 0–32)
AST: 13 IU/L (ref 0–40)
Albumin/Globulin Ratio: 1.2 (ref 1.2–2.2)
Albumin: 4.3 g/dL (ref 3.8–4.9)
Alkaline Phosphatase: 124 IU/L — ABNORMAL HIGH (ref 44–121)
BUN/Creatinine Ratio: 11 (ref 9–23)
BUN: 9 mg/dL (ref 6–24)
Bilirubin Total: 0.2 mg/dL (ref 0.0–1.2)
CO2: 23 mmol/L (ref 20–29)
Calcium: 9.9 mg/dL (ref 8.7–10.2)
Chloride: 105 mmol/L (ref 96–106)
Creatinine, Ser: 0.79 mg/dL (ref 0.57–1.00)
Globulin, Total: 3.6 g/dL (ref 1.5–4.5)
Glucose: 130 mg/dL — ABNORMAL HIGH (ref 70–99)
Potassium: 4.6 mmol/L (ref 3.5–5.2)
Sodium: 140 mmol/L (ref 134–144)
Total Protein: 7.9 g/dL (ref 6.0–8.5)
eGFR: 87 mL/min/{1.73_m2} (ref >59)

## 2021-08-08 ENCOUNTER — Other Ambulatory Visit: Payer: Self-pay

## 2021-08-25 ENCOUNTER — Other Ambulatory Visit: Payer: Self-pay | Admitting: Physician Assistant

## 2021-08-25 ENCOUNTER — Other Ambulatory Visit: Payer: Self-pay | Admitting: Nurse Practitioner

## 2021-08-25 ENCOUNTER — Other Ambulatory Visit: Payer: Self-pay

## 2021-08-25 DIAGNOSIS — I10 Essential (primary) hypertension: Secondary | ICD-10-CM

## 2021-08-25 DIAGNOSIS — E782 Mixed hyperlipidemia: Secondary | ICD-10-CM

## 2021-08-26 ENCOUNTER — Other Ambulatory Visit: Payer: Self-pay

## 2021-08-26 MED ORDER — AMLODIPINE BESYLATE 10 MG PO TABS
10.0000 mg | ORAL_TABLET | Freq: Every day | ORAL | 0 refills | Status: DC
  Filled 2021-08-26: qty 90, 90d supply, fill #0

## 2021-08-26 NOTE — Telephone Encounter (Signed)
Rx not noted received - will resend ?Requested Prescriptions  ?Pending Prescriptions Disp Refills  ?? amLODipine (NORVASC) 10 MG tablet 90 tablet 0  ?  Sig: Take 1 tablet (10 mg total) by mouth daily.  ?  ? Cardiovascular: Calcium Channel Blockers 2 Passed - 08/25/2021  4:52 PM  ?  ?  Passed - Last BP in normal range  ?  BP Readings from Last 1 Encounters:  ?07/18/21 118/82  ?   ?  ?  Passed - Last Heart Rate in normal range  ?  Pulse Readings from Last 1 Encounters:  ?07/18/21 70  ?   ?  ?  Passed - Valid encounter within last 6 months  ?  Recent Outpatient Visits   ?      ? 1 month ago Essential hypertension  ? [place], [place] W, NP  ? 4 months ago Mixed hyperlipidemia  ? [place], [name]  ? 11 months ago Bleeding nose  ? [place], [place], [name]  ? 3 years ago Uterine bleeding, dysfunctional  ? [place], [place] W, NP  ? 3 years ago Essential hypertension  ? [facility] Gildardo Pounds, NP  ?  ?  ?Future Appointments   ?        ? In 1 month Gildardo Pounds, NP [place]  ?  ? ?  ?  ?  ? ?

## 2021-08-27 ENCOUNTER — Other Ambulatory Visit: Payer: Self-pay

## 2021-08-27 ENCOUNTER — Other Ambulatory Visit: Payer: Self-pay | Admitting: Nurse Practitioner

## 2021-08-27 DIAGNOSIS — E782 Mixed hyperlipidemia: Secondary | ICD-10-CM

## 2021-08-27 DIAGNOSIS — I10 Essential (primary) hypertension: Secondary | ICD-10-CM

## 2021-08-27 MED ORDER — ATORVASTATIN CALCIUM 20 MG PO TABS
ORAL_TABLET | ORAL | 2 refills | Status: DC
  Filled 2021-08-27: qty 30, 30d supply, fill #0
  Filled 2021-09-26: qty 60, 60d supply, fill #1

## 2021-08-27 MED ORDER — CLONIDINE HCL 0.1 MG PO TABS
0.1000 mg | ORAL_TABLET | Freq: Two times a day (BID) | ORAL | 2 refills | Status: DC
  Filled 2021-08-27 – 2021-09-10 (×2): qty 60, 30d supply, fill #0
  Filled 2021-10-14: qty 60, 30d supply, fill #1

## 2021-08-28 ENCOUNTER — Other Ambulatory Visit: Payer: Self-pay

## 2021-09-10 ENCOUNTER — Other Ambulatory Visit: Payer: Self-pay

## 2021-09-12 ENCOUNTER — Other Ambulatory Visit: Payer: Self-pay

## 2021-09-26 ENCOUNTER — Other Ambulatory Visit: Payer: Self-pay

## 2021-10-14 ENCOUNTER — Other Ambulatory Visit: Payer: Self-pay

## 2021-10-15 ENCOUNTER — Other Ambulatory Visit: Payer: Self-pay

## 2021-10-20 ENCOUNTER — Ambulatory Visit: Payer: Self-pay | Attending: Nurse Practitioner | Admitting: Nurse Practitioner

## 2021-10-20 ENCOUNTER — Encounter: Payer: Self-pay | Admitting: Nurse Practitioner

## 2021-10-20 VITALS — BP 134/86 | HR 62 | Temp 98.5°F | Resp 20 | Wt 218.0 lb

## 2021-10-20 DIAGNOSIS — Z1211 Encounter for screening for malignant neoplasm of colon: Secondary | ICD-10-CM

## 2021-10-20 DIAGNOSIS — Z1231 Encounter for screening mammogram for malignant neoplasm of breast: Secondary | ICD-10-CM

## 2021-10-20 DIAGNOSIS — I10 Essential (primary) hypertension: Secondary | ICD-10-CM

## 2021-10-20 MED ORDER — AMLODIPINE BESYLATE 10 MG PO TABS
10.0000 mg | ORAL_TABLET | Freq: Every day | ORAL | 0 refills | Status: DC
  Filled 2021-10-20: qty 30, 30d supply, fill #0

## 2021-10-20 MED ORDER — CLONIDINE HCL 0.1 MG PO TABS
0.1000 mg | ORAL_TABLET | Freq: Two times a day (BID) | ORAL | 0 refills | Status: DC
  Filled 2021-10-20: qty 180, 90d supply, fill #0
  Filled 2021-11-14: qty 60, 30d supply, fill #0
  Filled 2021-12-17: qty 60, 30d supply, fill #1

## 2021-10-20 NOTE — Progress Notes (Signed)
F/u BP

## 2021-10-20 NOTE — Progress Notes (Signed)
for  Assessment & Plan:  Connie Bryant was seen today for hypertension.  Diagnoses and all orders for this visit:  Essential hypertension -     cloNIDine (CATAPRES) 0.1 MG tablet; Take 1 tablet (0.1 mg total) by mouth 2 (two) times daily. -     amLODipine (NORVASC) 10 MG tablet; Take 1 tablet (10 mg total) by mouth daily.  Colon cancer screening -     Fecal occult blood, imunochemical([facility])  Breast cancer screening by mammogram -     MM 3D SCREEN BREAST BILATERAL; Future    Patient has been counseled on age-appropriate routine health concerns for screening and prevention. These are reviewed and up-to-date. Referrals have been placed accordingly. Immunizations are up-to-date or declined.    Subjective:   Chief Complaint  Patient presents with   Hypertension   HPI Connie Bryant 56 y.o. female presents to office today for follow up to HTN   Notes BP readings high at home with SBP 160s. She has had her BP cuff for several years. Will need to bring it to her next office visit for correlation. Currently taking amlodipine 10 mg daily and clonidine 0.1 mg BID. BP Readings from Last 3 Encounters:  10/20/21 134/86  07/18/21 118/82  04/17/21 129/84     Review of Systems  Constitutional:  Negative for fever, malaise/fatigue and weight loss.  HENT: Negative.  Negative for nosebleeds.   Eyes: Negative.  Negative for blurred vision, double vision and photophobia.  Respiratory: Negative.  Negative for cough and shortness of breath.   Cardiovascular: Negative.  Negative for chest pain, palpitations and leg swelling.  Gastrointestinal: Negative.  Negative for heartburn, nausea and vomiting.  Musculoskeletal: Negative.  Negative for myalgias.  Neurological: Negative.  Negative for dizziness, focal weakness, seizures and headaches.  Psychiatric/Behavioral: Negative.  Negative for suicidal ideas.    Past Medical History:  Diagnosis Date   Anemia    Glaucoma    Hypertension     Obesity    Pneumonia    2008    Past Surgical History:  Procedure Laterality Date   TUBAL LIGATION      Family History  Problem Relation Age of Onset   Diabetes Brother    Heart disease Brother    Heart attack Brother    Diabetes Mother    Kidney disease Mother    Heart attack Mother    Diabetes Sister    Heart attack Sister    Breast cancer Cousin     Social History Reviewed with no changes to be made today.   Outpatient Medications Prior to Visit  Medication Sig Dispense Refill   atorvastatin (LIPITOR) 20 MG tablet TAKE 1 TABLET BY MOUTH EVERY DAY 30 tablet 2   ibuprofen (ADVIL) 600 MG tablet Take 1 tablet (600 mg total) by mouth every 8 (eight) hours as needed for moderate pain or cramping. Take with food. 60 tablet 1   amLODipine (NORVASC) 10 MG tablet Take 1 tablet (10 mg total) by mouth daily. 90 tablet 0   cloNIDine (CATAPRES) 0.1 MG tablet Take 1 tablet (0.1 mg total) by mouth 2 (two) times daily. 60 tablet 2   No facility-administered medications prior to visit.    Allergies  Allergen Reactions   Lisinopril Cough   Oxycodone-Acetaminophen Hives   Tylox [Oxycodone-Acetaminophen] Hives       Objective:    BP 134/86 (BP Location: Left Arm, Patient Position: Sitting, Cuff Size: Large)   Pulse 62   Temp 98.5 F (36.9  C) (Oral)   Resp 20   Wt 218 lb (98.9 kg)   LMP  (Approximate)   SpO2 96%   BMI 31.28 kg/m  Wt Readings from Last 3 Encounters:  10/20/21 218 lb (98.9 kg)  07/18/21 218 lb (98.9 kg)  04/17/21 223 lb (101.2 kg)    Physical Exam Vitals and nursing note reviewed.  Constitutional:      Appearance: She is well-developed.  HENT:     Head: Normocephalic and atraumatic.  Cardiovascular:     Rate and Rhythm: Normal rate and regular rhythm.     Heart sounds: Normal heart sounds. No murmur heard.   No friction rub. No gallop.  Pulmonary:     Effort: Pulmonary effort is normal. No tachypnea or respiratory distress.     Breath sounds:  Normal breath sounds. No decreased breath sounds, wheezing, rhonchi or rales.  Chest:     Chest wall: No tenderness.  Abdominal:     General: Bowel sounds are normal.     Palpations: Abdomen is soft.  Musculoskeletal:        General: Normal range of motion.     Cervical back: Normal range of motion.  Skin:    General: Skin is warm and dry.  Neurological:     Mental Status: She is alert and oriented to person, place, and time.     Coordination: Coordination normal.  Psychiatric:        Behavior: Behavior normal. Behavior is cooperative.        Thought Content: Thought content normal.        Judgment: Judgment normal.         Patient has been counseled extensively about nutrition and exercise as well as the importance of adherence with medications and regular follow-up. The patient was given clear instructions to go to ER or return to medical center if symptoms don't improve, worsen or new problems develop. The patient verbalized understanding.   Follow-up: Return in about 3 months (around 01/20/2022).   Gildardo Pounds, FNP-BC [facility] and [place], [place]   10/20/2021, 8:31 PM

## 2021-10-21 ENCOUNTER — Other Ambulatory Visit: Payer: Self-pay

## 2021-10-22 ENCOUNTER — Other Ambulatory Visit: Payer: Self-pay

## 2021-10-23 ENCOUNTER — Other Ambulatory Visit: Payer: Self-pay

## 2021-10-28 ENCOUNTER — Telehealth: Payer: Self-pay | Admitting: Nurse Practitioner

## 2021-10-28 NOTE — Telephone Encounter (Signed)
Copied from [place] [id number]. Topic: General - Other >> Oct 28, 2021  1:18 PM Leilani Able wrote: Pt states that she had ask for a mammogram to be done and no one ever called her re, pt was told there is an order in the system but she states that she has no idea where to go as no one called her. She wants an appt made and a call back to tell her the time and place [phone] states to leave a message on her answering machine.

## 2021-10-30 NOTE — Telephone Encounter (Signed)
Can you help me with this, Please

## 2021-10-31 NOTE — Telephone Encounter (Signed)
Called pt and gave her the to [facility]

## 2021-11-10 ENCOUNTER — Ambulatory Visit: Payer: Self-pay

## 2021-11-10 ENCOUNTER — Ambulatory Visit
Admission: RE | Admit: 2021-11-10 | Discharge: 2021-11-10 | Disposition: A | Discharge status: Home / Self Care | Payer: Commercial Managed Care - HMO | Source: Ambulatory Visit | Attending: Nurse Practitioner | Admitting: Nurse Practitioner

## 2021-11-10 DIAGNOSIS — Z1231 Encounter for screening mammogram for malignant neoplasm of breast: Secondary | ICD-10-CM

## 2021-11-14 ENCOUNTER — Other Ambulatory Visit: Payer: Self-pay

## 2021-11-17 ENCOUNTER — Other Ambulatory Visit: Payer: Self-pay

## 2021-11-25 ENCOUNTER — Other Ambulatory Visit: Payer: Self-pay | Admitting: Nurse Practitioner

## 2021-11-25 DIAGNOSIS — I10 Essential (primary) hypertension: Secondary | ICD-10-CM

## 2021-11-25 NOTE — Telephone Encounter (Signed)
Connie Bryant with [facility] called saying the patient has changed pharmacies to mail order.  She is asking for Amlodipine 10 mg  CB#  [phone]

## 2021-11-26 MED ORDER — AMLODIPINE BESYLATE 10 MG PO TABS: 10.0000 mg | ORAL_TABLET | Freq: Every day | ORAL | 0 refills | Status: DC

## 2021-11-26 NOTE — Telephone Encounter (Signed)
Requested different pharmacy Requested Prescriptions  Pending Prescriptions Disp Refills  . amLODipine (NORVASC) 10 MG tablet 90 tablet 0    Sig: Take 1 tablet (10 mg total) by mouth daily.     Cardiovascular: Calcium Channel Blockers 2 Passed - 11/25/2021 12:51 PM      Passed - Last BP in normal range    BP Readings from Last 1 Encounters:  10/20/21 134/86         Passed - Last Heart Rate in normal range    Pulse Readings from Last 1 Encounters:  10/20/21 62         Passed - Valid encounter within last 6 months    Recent Outpatient Visits          1 month ago Essential hypertension   [place], [name], NP   4 months ago Essential hypertension   [place], NP   7 months ago Mixed hyperlipidemia   [place], [name]   1 year ago Bleeding nose   [place], [name]   3 years ago Uterine bleeding, dysfunctional   [place], [name], NP      Future Appointments            In 2 months Gildardo Pounds, NP [place]

## 2021-11-27 ENCOUNTER — Other Ambulatory Visit: Payer: Self-pay | Admitting: Family Medicine

## 2021-11-27 ENCOUNTER — Other Ambulatory Visit: Payer: Self-pay

## 2021-11-27 DIAGNOSIS — E782 Mixed hyperlipidemia: Secondary | ICD-10-CM

## 2021-11-27 MED ORDER — ATORVASTATIN CALCIUM 20 MG PO TABS
ORAL_TABLET | ORAL | 2 refills | Status: DC
  Filled 2021-11-27 – 2021-12-04 (×2): qty 30, 30d supply, fill #0

## 2021-12-03 ENCOUNTER — Other Ambulatory Visit: Payer: Self-pay

## 2021-12-04 ENCOUNTER — Other Ambulatory Visit: Payer: Self-pay

## 2021-12-17 ENCOUNTER — Other Ambulatory Visit: Payer: Self-pay

## 2021-12-17 ENCOUNTER — Telehealth: Payer: Self-pay | Admitting: Emergency Medicine

## 2021-12-17 DIAGNOSIS — I10 Essential (primary) hypertension: Secondary | ICD-10-CM

## 2021-12-17 DIAGNOSIS — E782 Mixed hyperlipidemia: Secondary | ICD-10-CM

## 2021-12-17 NOTE — Telephone Encounter (Signed)
Copied from [place]. Topic: General - Other >> Dec 17, 2021  4:19 PM Everette C wrote: Reason for CRM: The patient has been directed by [facility] to contact their PCP and request completion of a prior authorization for their  Rx #: [id number]  atorvastatin (LIPITOR) 20 MG tablet [[id number]]  Rx #: [id number]  cloNIDine (CATAPRES) 0.1 MG tablet [[id number]]   Please contact [phone] for further completion   Please contact the patient further once the prior authorization is completed

## 2021-12-30 MED ORDER — CLONIDINE HCL 0.1 MG PO TABS: 0.1000 mg | ORAL_TABLET | Freq: Two times a day (BID) | ORAL | 0 refills | Status: DC

## 2021-12-30 MED ORDER — ATORVASTATIN CALCIUM 20 MG PO TABS: ORAL_TABLET | ORAL | 0 refills | Status: DC

## 2021-12-30 NOTE — Addendum Note (Signed)
Addended by: Daisy Blossom, Annie Main L on: 12/30/2021 12:04 PM   Modules accepted: Orders

## 2021-12-30 NOTE — Telephone Encounter (Signed)
Call placed to patient. She needs 90-day supplies sent to [facility]. I sent these rxs for her.

## 2021-12-30 NOTE — Telephone Encounter (Signed)
Pt is calling back and requesting a call back. Pt stated she is almost out of her medication and needs this taken care of so [place] can send her medication.   Please contact [phone] for further completion .  Pt requesting a call back today.   [facility] [place], [place]  [place] [address]  Phone: [phone] Fax: [phone]  Hours: Not open 24 hours

## 2022-02-06 ENCOUNTER — Other Ambulatory Visit: Payer: Self-pay

## 2022-02-06 ENCOUNTER — Encounter: Payer: Self-pay | Admitting: Nurse Practitioner

## 2022-02-06 ENCOUNTER — Ambulatory Visit: Payer: Commercial Managed Care - HMO | Attending: Nurse Practitioner | Admitting: Nurse Practitioner

## 2022-02-06 VITALS — BP 130/85 | HR 71 | Temp 98.3°F | Ht 70.0 in | Wt 213.4 lb

## 2022-02-06 DIAGNOSIS — Z23 Encounter for immunization: Secondary | ICD-10-CM | POA: Diagnosis not present

## 2022-02-06 DIAGNOSIS — M25552 Pain in left hip: Secondary | ICD-10-CM

## 2022-02-06 DIAGNOSIS — E782 Mixed hyperlipidemia: Secondary | ICD-10-CM

## 2022-02-06 DIAGNOSIS — R7303 Prediabetes: Secondary | ICD-10-CM | POA: Diagnosis not present

## 2022-02-06 DIAGNOSIS — I10 Essential (primary) hypertension: Secondary | ICD-10-CM

## 2022-02-06 DIAGNOSIS — Z862 Personal history of diseases of the blood and blood-forming organs and certain disorders involving the immune mechanism: Secondary | ICD-10-CM | POA: Diagnosis not present

## 2022-02-06 MED ORDER — IBUPROFEN 600 MG PO TABS
600.0000 mg | ORAL_TABLET | Freq: Three times a day (TID) | ORAL | 1 refills | Status: DC | PRN
  Filled 2022-02-06: qty 60, 20d supply, fill #0

## 2022-02-06 NOTE — Progress Notes (Signed)
Assessment & Plan:  Connie Bryant was seen today for hypertension.  Diagnoses and all orders for this visit:  Essential hypertension -     CMP14+EGFR Continue all antihypertensives as prescribed.  Reminded to bring in blood pressure log for follow  up appointment.  RECOMMENDATIONS: DASH/Mediterranean Diets are healthier choices for HTN.    Prediabetes -     Hemoglobin A1c Continue blood sugar control as discussed in office today, low carbohydrate diet, and regular physical exercise as tolerated, 150 minutes per week (30 min each day, 5 days per week, or 50 min 3 days per week). Keep blood sugar logs with fasting goal of 90-130 mg/dl, post prandial (after you eat) less than 180.   History of anemia -     CBC  Mixed Hyperlipidemia INSTRUCTIONS: Work on a low fat, heart healthy diet and participate in regular aerobic exercise program by working out at least 150 minutes per week; 5 days a week-30 minutes per day. Avoid red meat/beef/steak,  fried foods. junk foods, sodas, sugary drinks, unhealthy snacking, alcohol and smoking.  Drink at least 80 oz of water per day and monitor your carbohydrate intake daily.     Pain of left hip -     ibuprofen (ADVIL) 600 MG tablet; Take 1 tablet (600 mg total) by mouth every 8 (eight) hours as needed for moderate pain or cramping. Take with food. Work on losing weight to help reduce joint pain. May alternate with heat and ice application for pain relief. May also alternate with acetaminophen as prescribed pain relief. Other alternatives include massage, acupuncture and water aerobics.  You must stay active and avoid a sedentary lifestyle.     Patient has been counseled on age-appropriate routine health concerns for screening and prevention. These are reviewed and up-to-date. Referrals have been placed accordingly. Immunizations are up-to-date or declined.    Subjective:   Chief Complaint  Patient presents with   Hypertension    No questions / concerns.     Hypertension Pertinent negatives include no blurred vision, chest pain, headaches, malaise/fatigue, palpitations or shortness of breath.   Connie Bryant 56 y.o. female presents to office today for follow up to HTN   HTN Blood pressure is well controlled with amlodipine 10 mg daily and clonidine 0.1 mg BID.  BP Readings from Last 3 Encounters:  02/06/22 130/85  10/20/21 134/86  07/18/21 118/82     Review of Systems  Constitutional:  Negative for fever, malaise/fatigue and weight loss.  HENT: Negative.  Negative for nosebleeds.   Eyes: Negative.  Negative for blurred vision, double vision and photophobia.  Respiratory: Negative.  Negative for cough and shortness of breath.   Cardiovascular: Negative.  Negative for chest pain, palpitations and leg swelling.  Gastrointestinal: Negative.  Negative for heartburn, nausea and vomiting.  Musculoskeletal:  Positive for joint pain. Negative for myalgias.  Neurological: Negative.  Negative for dizziness, focal weakness, seizures and headaches.  Psychiatric/Behavioral: Negative.  Negative for suicidal ideas.     Past Medical History:  Diagnosis Date   Anemia    Glaucoma    Hypertension    Obesity    Pneumonia    2008    Past Surgical History:  Procedure Laterality Date   TUBAL LIGATION      Family History  Problem Relation Age of Onset   Diabetes Brother    Heart disease Brother    Heart attack Brother    Diabetes Mother    Kidney disease Mother  Heart attack Mother    Diabetes Sister    Heart attack Sister    Breast cancer Cousin     Social History Reviewed with no changes to be made today.   Outpatient Medications Prior to Visit  Medication Sig Dispense Refill   amLODipine (NORVASC) 10 MG tablet Take 1 tablet (10 mg total) by mouth daily. 90 tablet 0   atorvastatin (LIPITOR) 20 MG tablet TAKE 1 TABLET BY MOUTH EVERY DAY 90 tablet 0   cloNIDine (CATAPRES) 0.1 MG tablet Take 1 tablet (0.1 mg total) by mouth 2  (two) times daily. 180 tablet 0   ibuprofen (ADVIL) 600 MG tablet Take 1 tablet (600 mg total) by mouth every 8 (eight) hours as needed for moderate pain or cramping. Take with food. 60 tablet 1   No facility-administered medications prior to visit.    Allergies  Allergen Reactions   Lisinopril Cough   Oxycodone-Acetaminophen Hives   Tylox [Oxycodone-Acetaminophen] Hives       Objective:    BP 130/85 (BP Location: Left Arm, Patient Position: Sitting, Cuff Size: Large)   Pulse 71   Temp 98.3 F (36.8 C) (Oral)   Ht 5' 10"  (1.778 m)   Wt 213 lb 6.4 oz (96.8 kg)   SpO2 100%   BMI 30.62 kg/m  Wt Readings from Last 3 Encounters:  02/06/22 213 lb 6.4 oz (96.8 kg)  10/20/21 218 lb (98.9 kg)  07/18/21 218 lb (98.9 kg)    Physical Exam Vitals and nursing note reviewed.  Constitutional:      Appearance: She is well-developed.  HENT:     Head: Normocephalic and atraumatic.  Cardiovascular:     Rate and Rhythm: Normal rate and regular rhythm.     Heart sounds: Normal heart sounds. No murmur heard.    No friction rub. No gallop.  Pulmonary:     Effort: Pulmonary effort is normal. No tachypnea or respiratory distress.     Breath sounds: Normal breath sounds. No decreased breath sounds, wheezing, rhonchi or rales.  Chest:     Chest wall: No tenderness.  Abdominal:     General: Bowel sounds are normal.     Palpations: Abdomen is soft.  Musculoskeletal:        General: Normal range of motion.     Cervical back: Normal range of motion.  Skin:    General: Skin is warm and dry.  Neurological:     Mental Status: She is alert and oriented to person, place, and time.     Coordination: Coordination normal.  Psychiatric:        Behavior: Behavior normal. Behavior is cooperative.        Thought Content: Thought content normal.        Judgment: Judgment normal.          Patient has been counseled extensively about nutrition and exercise as well as the importance of adherence  with medications and regular follow-up. The patient was given clear instructions to go to ER or return to medical center if symptoms don't improve, worsen or new problems develop. The patient verbalized understanding.   Follow-up: Return in about 3 months (around 05/08/2022).   Gildardo Pounds, FNP-BC [facility] and [place], [place]   02/06/2022, 4:25 PM

## 2022-02-06 NOTE — Patient Instructions (Signed)
Citracal with calcium and vitamin D

## 2022-02-07 LAB — CMP14+EGFR
ALT: 22 IU/L (ref 0–32)
AST: 16 IU/L (ref 0–40)
Albumin/Globulin Ratio: 1.1 — ABNORMAL LOW (ref 1.2–2.2)
Albumin: 4.3 g/dL (ref 3.8–4.9)
Alkaline Phosphatase: 142 IU/L — ABNORMAL HIGH (ref 44–121)
BUN/Creatinine Ratio: 10 (ref 9–23)
BUN: 8 mg/dL (ref 6–24)
Bilirubin Total: 0.3 mg/dL (ref 0.0–1.2)
CO2: 23 mmol/L (ref 20–29)
Calcium: 10.1 mg/dL (ref 8.7–10.2)
Chloride: 103 mmol/L (ref 96–106)
Creatinine, Ser: 0.78 mg/dL (ref 0.57–1.00)
Globulin, Total: 4 g/dL (ref 1.5–4.5)
Glucose: 94 mg/dL (ref 70–99)
Potassium: 4.6 mmol/L (ref 3.5–5.2)
Sodium: 140 mmol/L (ref 134–144)
Total Protein: 8.3 g/dL (ref 6.0–8.5)
eGFR: 89 mL/min/{1.73_m2} (ref >59)

## 2022-02-07 LAB — CBC
Hematocrit: 40.9 % (ref 34.0–46.6)
Hemoglobin: 13.4 g/dL (ref 11.1–15.9)
MCH: 27.5 pg (ref 26.6–33.0)
MCHC: 32.8 g/dL (ref 31.5–35.7)
MCV: 84 fL (ref 79–97)
Platelets: 266 10*3/uL (ref 150–450)
RBC: 4.88 x10E6/uL (ref 3.77–5.28)
RDW: 13.2 % (ref 11.7–15.4)
WBC: 6.6 10*3/uL (ref 3.4–10.8)

## 2022-02-07 LAB — HEMOGLOBIN A1C
Est. average glucose Bld gHb Est-mCnc: 126 mg/dL
Hgb A1c MFr Bld: 6 % — ABNORMAL HIGH (ref 4.8–5.6)

## 2022-02-09 ENCOUNTER — Other Ambulatory Visit: Payer: Self-pay

## 2022-02-13 LAB — FECAL OCCULT BLOOD, IMMUNOCHEMICAL: Fecal Occult Bld: NEGATIVE

## 2022-03-03 ENCOUNTER — Other Ambulatory Visit: Payer: Self-pay | Admitting: Nurse Practitioner

## 2022-03-03 DIAGNOSIS — E782 Mixed hyperlipidemia: Secondary | ICD-10-CM

## 2022-03-03 DIAGNOSIS — I10 Essential (primary) hypertension: Secondary | ICD-10-CM

## 2022-03-03 MED ORDER — AMLODIPINE BESYLATE 10 MG PO TABS: 10.0000 mg | ORAL_TABLET | Freq: Every day | ORAL | 0 refills | Status: DC

## 2022-03-03 MED ORDER — CLONIDINE HCL 0.1 MG PO TABS: 0.1000 mg | ORAL_TABLET | Freq: Two times a day (BID) | ORAL | 0 refills | Status: DC

## 2022-03-03 MED ORDER — ATORVASTATIN CALCIUM 20 MG PO TABS: ORAL_TABLET | ORAL | 0 refills | Status: DC

## 2022-03-03 NOTE — Telephone Encounter (Signed)
Requested Prescriptions  Pending Prescriptions Disp Refills  . amLODipine (NORVASC) 10 MG tablet 90 tablet 0    Sig: Take 1 tablet (10 mg total) by mouth daily.     Cardiovascular: Calcium Channel Blockers 2 Passed - 03/03/2022  8:51 AM      Passed - Last BP in normal range    BP Readings from Last 1 Encounters:  02/06/22 130/85         Passed - Last Heart Rate in normal range    Pulse Readings from Last 1 Encounters:  02/06/22 71         Passed - Valid encounter within last 6 months    Recent Outpatient Visits          3 weeks ago Essential hypertension   [place], [name], NP   4 months ago Essential hypertension   [place], [name], NP   7 months ago Essential hypertension   [place], [name], NP   10 months ago Mixed hyperlipidemia   [place], [name]   1 year ago Bleeding nose   [place], Dionne Bucy, [name]      Future Appointments            In 2 months Gildardo Pounds, NP [place]           . atorvastatin (LIPITOR) 20 MG tablet 90 tablet 0    Sig: TAKE 1 TABLET BY MOUTH EVERY DAY     Cardiovascular:  Antilipid - Statins Failed - 03/03/2022  8:51 AM      Failed - Lipid Panel in normal range within the last 12 months    Cholesterol, Total  Date Value Ref Range Status  04/24/2021 141 100 - 199 mg/dL Final   LDL Chol Calc (NIH)  Date Value Ref Range Status  04/24/2021 84 0 - 99 mg/dL Final   HDL  Date Value Ref Range Status  04/24/2021 40 >39 mg/dL Final   Triglycerides  Date Value Ref Range Status  04/24/2021 88 0 - 149 mg/dL Final         Passed - Patient is not pregnant      Passed - Valid encounter within last 12 months    Recent Outpatient Visits          3 weeks ago Essential hypertension   [place], [name], NP   4 months ago Essential hypertension   [place], [name], NP   7 months ago Essential hypertension   [place], [name], NP   10 months ago Mixed hyperlipidemia   [place], [name]   1 year ago Bleeding nose   [place], Dionne Bucy, [name]      Future Appointments            In 2 months Gildardo Pounds, NP [place]           . cloNIDine (CATAPRES) 0.1 MG tablet 180 tablet 0    Sig: Take 1 tablet (0.1 mg total) by mouth 2 (two) times daily.     Cardiovascular:  Alpha-2 Agonists Passed - 03/03/2022  8:51  AM      Passed - Last BP in normal range    BP Readings from Last 1 Encounters:  02/06/22 130/85         Passed - Last Heart Rate in normal range    Pulse Readings from Last 1 Encounters:  02/06/22 71         Passed - Valid encounter within last 6 months    Recent Outpatient Visits          3 weeks ago Essential hypertension   [place], [name], NP   4 months ago Essential hypertension   [place], [name], NP   7 months ago Essential hypertension   [place], [name], NP   10 months ago Mixed hyperlipidemia   [place], [name]   1 year ago Bleeding nose   [place], Dionne Bucy, [name]      Future Appointments            In 2 months Gildardo Pounds, NP [place]

## 2022-03-03 NOTE — Telephone Encounter (Signed)
Medication Refill - Medication: Amlodipine 10 mg, Atorvastatin 20 mg and Clonidine   Has the patient contacted their pharmacy? Yes.   (Agent: If no, request that the patient contact the pharmacy for the refill. If patient does not wish to contact the pharmacy document the reason why and proceed with request.) (Agent: If yes, when and what did the pharmacy advise?)  Preferred Pharmacy (with phone number or street name): [facility] Has the patient been seen for an appointment in the last year OR does the patient have an upcoming appointment? Yes.    Agent: Please be advised that RX refills may take up to 3 business days. We ask that you follow-up with your pharmacy.

## 2022-05-07 ENCOUNTER — Ambulatory Visit: Payer: Commercial Managed Care - HMO | Admitting: Physician Assistant

## 2022-05-07 ENCOUNTER — Ambulatory Visit: Payer: Commercial Managed Care - HMO | Attending: Nurse Practitioner | Admitting: Physician Assistant

## 2022-05-07 ENCOUNTER — Encounter: Payer: Self-pay | Admitting: Physician Assistant

## 2022-05-07 VITALS — BP 137/86 | HR 59 | Wt 215.2 lb

## 2022-05-07 DIAGNOSIS — I10 Essential (primary) hypertension: Secondary | ICD-10-CM

## 2022-05-07 DIAGNOSIS — E782 Mixed hyperlipidemia: Secondary | ICD-10-CM

## 2022-05-07 DIAGNOSIS — R7303 Prediabetes: Secondary | ICD-10-CM

## 2022-05-07 DIAGNOSIS — M25552 Pain in left hip: Secondary | ICD-10-CM | POA: Diagnosis not present

## 2022-05-07 MED ORDER — ATORVASTATIN CALCIUM 20 MG PO TABS: ORAL_TABLET | ORAL | 1 refills | Status: DC

## 2022-05-07 MED ORDER — AMLODIPINE BESYLATE 10 MG PO TABS: 10.0000 mg | ORAL_TABLET | Freq: Every day | ORAL | 1 refills | Status: DC

## 2022-05-07 MED ORDER — CLONIDINE HCL 0.1 MG PO TABS: 0.1000 mg | ORAL_TABLET | Freq: Two times a day (BID) | ORAL | 1 refills | Status: DC

## 2022-05-07 MED ORDER — IBUPROFEN 600 MG PO TABS: 600.0000 mg | ORAL_TABLET | Freq: Three times a day (TID) | ORAL | 1 refills | Status: DC | PRN

## 2022-05-07 NOTE — Progress Notes (Signed)
Connie Bryant, is a 56 y.o. female  HYQ:[id number]  [id number]:[id number]  DOB - 1965-12-16  No chief complaint on file.      Subjective:   Connie Bryant is a 56 y.o. female here today for med RF.  She may need them sent to a different pharmacy bc getting new insurance in January.  No new issues or concerns.  she is working on decreasing carbs due to A1C=6.0.  no HA/CP/dizziness.    No problems updated.  ALLERGIES: Allergies  Allergen Reactions   Lisinopril Cough   Oxycodone-Acetaminophen Hives   Tylox [Oxycodone-Acetaminophen] Hives    PAST MEDICAL HISTORY: Past Medical History:  Diagnosis Date   Anemia    Glaucoma    Hypertension    Obesity    Pneumonia    2008    MEDICATIONS AT HOME: Prior to Admission medications   Medication Sig Start Date End Date Taking? Authorizing Provider  amLODipine (NORVASC) 10 MG tablet Take 1 tablet (10 mg total) by mouth daily. 05/07/22 08/05/22  Argentina Donovan, PA-C  atorvastatin (LIPITOR) 20 MG tablet TAKE 1 TABLET BY MOUTH EVERY DAY 05/07/22   Argentina Donovan, PA-C  cloNIDine (CATAPRES) 0.1 MG tablet Take 1 tablet (0.1 mg total) by mouth 2 (two) times daily. 05/07/22 08/05/22  Argentina Donovan, PA-C  ibuprofen (ADVIL) 600 MG tablet Take 1 tablet (600 mg total) by mouth every 8 (eight) hours as needed for moderate pain or cramping. Take with food. 05/07/22   [place] Brule, Dionne Bucy, PA-C    ROS: Neg HEENT Neg resp Neg cardiac Neg GI Neg GU Neg MS Neg psych Neg neuro  Objective:   Vitals:   05/07/22 1557  BP: 137/86  Pulse: (!) 59  SpO2: 100%  Weight: 215 lb 3.2 oz (97.6 kg)   Exam General appearance : Awake, alert, not in any distress. Speech Clear. Not toxic looking HEENT: Atraumatic and Normocephalic Neck: Supple, no JVD. No cervical lymphadenopathy.  Chest: Good air entry bilaterally, CTAB.  No rales/rhonchi/wheezing CVS: S1 S2 regular, no murmurs.  Extremities: B/L Lower Ext shows no edema, both legs are warm to  touch Neurology: Awake alert, and oriented X 3, CN II-XII intact, Non focal Skin: No Rash  Data Review Lab Results  Component Value Date   HGBA1C 6.0 (H) 02/06/2022   HGBA1C 5.9 (A) 07/18/2021   HGBA1C 5.9 (H) 09/19/2020    Assessment & Plan   1. Essential hypertension Controlled.  OOO 130/70-80 - cloNIDine (CATAPRES) 0.1 MG tablet; Take 1 tablet (0.1 mg total) by mouth 2 (two) times daily.  Dispense: 180 tablet; Refill: 1 - amLODipine (NORVASC) 10 MG tablet; Take 1 tablet (10 mg total) by mouth daily.  Dispense: 90 tablet; Refill: 1  2. Mixed hyperlipidemia - atorvastatin (LIPITOR) 20 MG tablet; TAKE 1 TABLET BY MOUTH EVERY DAY  Dispense: 90 tablet; Refill: 1  3. Pain of left hip - ibuprofen (ADVIL) 600 MG tablet; Take 1 tablet (600 mg total) by mouth every 8 (eight) hours as needed for moderate pain or cramping. Take with food.  Dispense: 60 tablet; Refill: 1  4. Prediabetes I have had a lengthy discussion and provided education about insulin resistance and the intake of too much sugar/refined carbohydrates.  I have advised the patient to work at a goal of eliminating sugary drinks, candy, desserts, sweets, refined sugars, processed foods, and white carbohydrates.  The patient expresses understanding.   Can call if needs these RF transferred to a different pharmacy   Return  in about 6 months (around 11/06/2022) for PCP for chronic conditions.  The patient was given clear instructions to go to ER or return to medical center if symptoms don't improve, worsen or new problems develop. The patient verbalized understanding. The patient was told to call to get lab results if they haven't heard anything in the next week.      Freeman Caldron, PA-C [facility] and [place], [place]   05/07/2022, 4:24 PMPatient ID: Connie Bryant, female   DOB: 12/28/1965, 56 y.o.   MRN: [id number]

## 2022-05-07 NOTE — Patient Instructions (Signed)

## 2022-05-08 ENCOUNTER — Ambulatory Visit: Payer: Commercial Managed Care - HMO | Admitting: Nurse Practitioner

## 2022-05-25 ENCOUNTER — Telehealth: Payer: Self-pay | Admitting: Emergency Medicine

## 2022-05-25 NOTE — Telephone Encounter (Signed)
Copied from [place] [id number]. Topic: General - Inquiry >> May 25, 2022 11:15 AM Leilani Able wrote: Reason for CRM: Pt states that she is having a lot of problem with her ins and pharmacy due to a change, she states that she had been told by whoever was helping her that if she was having difficulty with the change and not able to afford to call and speak to [place]. She is wanting Carly to call her at [phone].  Pls leave a message with a return cb  number if she is working. Pls return call asap as she needs help to make sure she can get meds and the assistance.

## 2022-05-29 NOTE — Telephone Encounter (Signed)
Talked with patient about her medication since her insurance an pharmacy changed, I did offer to send medication downstairs but we both decided  that she would call back once she gets her insurance sorted out

## 2022-06-29 ENCOUNTER — Telehealth: Payer: Self-pay | Admitting: Nurse Practitioner

## 2022-06-29 NOTE — Telephone Encounter (Signed)
Disp Refills Start End   cloNIDine (CATAPRES) 0.1 MG tablet 180 tablet 1 05/07/2022      Disp Refills Start End   amLODipine (NORVASC) 10 MG tablet 90 tablet 1 05/07/2022 08/05/2022     Disp Refills Start End   atorvastatin (LIPITOR) 20 MG tablet 90 tablet 1 05/07/2022    Pt is wanting to change these scripts to  [facility] [place] Phone: [phone]  Fax: [phone]    Pls call  pt as she wants these scripts called in as changing to [place]. She states need to be rushed as only several more pills. Pt is upset when I tell her they are due to be filled in March. She explains the reason she is changing is [facility] is not going to charge her and agent was worried would be charged as too soon. Pt needs fu. Wants all new scripts as running out of meds now, [phone]

## 2022-07-01 NOTE — Telephone Encounter (Signed)
Patient is calling in to follow up on her medication refill.

## 2022-07-01 NOTE — Telephone Encounter (Signed)
Patient says she is running low on her medication and doesn't have a 90 day supply like she was told.

## 2022-07-06 ENCOUNTER — Telehealth: Payer: Self-pay | Admitting: Nurse Practitioner

## 2022-07-06 ENCOUNTER — Other Ambulatory Visit: Payer: Self-pay

## 2022-07-06 DIAGNOSIS — M25552 Pain in left hip: Secondary | ICD-10-CM

## 2022-07-06 DIAGNOSIS — I10 Essential (primary) hypertension: Secondary | ICD-10-CM

## 2022-07-06 DIAGNOSIS — E782 Mixed hyperlipidemia: Secondary | ICD-10-CM

## 2022-07-06 MED ORDER — CLONIDINE HCL 0.1 MG PO TABS: 0.1000 mg | ORAL_TABLET | Freq: Two times a day (BID) | ORAL | 1 refills | Status: DC

## 2022-07-06 MED ORDER — ATORVASTATIN CALCIUM 20 MG PO TABS: ORAL_TABLET | ORAL | 1 refills | Status: DC

## 2022-07-06 MED ORDER — AMLODIPINE BESYLATE 10 MG PO TABS: 10.0000 mg | ORAL_TABLET | Freq: Every day | ORAL | 1 refills | Status: DC

## 2022-07-06 MED ORDER — IBUPROFEN 600 MG PO TABS: 600.0000 mg | ORAL_TABLET | Freq: Three times a day (TID) | ORAL | 1 refills | Status: DC | PRN

## 2022-07-06 NOTE — Telephone Encounter (Signed)
It looks like these were refilled by PCP's nurse.

## 2022-07-06 NOTE — Telephone Encounter (Signed)
Patient is calling in again to follow up with her medication refill. Patient wants to know when she can expect her medication. Patient says she has a week's worth of medication remaining and wants to know when her refill will be filled. Please follow up with patient.

## 2022-07-09 ENCOUNTER — Other Ambulatory Visit: Payer: Self-pay | Admitting: Nurse Practitioner

## 2022-07-09 ENCOUNTER — Other Ambulatory Visit: Payer: Self-pay

## 2022-07-09 DIAGNOSIS — I10 Essential (primary) hypertension: Secondary | ICD-10-CM

## 2022-07-09 DIAGNOSIS — E782 Mixed hyperlipidemia: Secondary | ICD-10-CM

## 2022-07-09 MED ORDER — ATORVASTATIN CALCIUM 20 MG PO TABS
20.0000 mg | ORAL_TABLET | Freq: Every day | ORAL | 0 refills | Status: DC
  Filled 2022-07-09: qty 15, 15d supply, fill #0

## 2022-07-09 MED ORDER — AMLODIPINE BESYLATE 10 MG PO TABS
10.0000 mg | ORAL_TABLET | Freq: Every day | ORAL | 0 refills | Status: DC
  Filled 2022-07-09: qty 15, 15d supply, fill #0

## 2022-07-09 NOTE — Telephone Encounter (Signed)
Requested Prescriptions  Pending Prescriptions Disp Refills   amLODipine (NORVASC) 10 MG tablet 15 tablet 0    Sig: Take 1 tablet (10 mg total) by mouth daily.     Cardiovascular: Calcium Channel Blockers 2 Passed - 07/09/2022 11:09 AM      Passed - Last BP in normal range    BP Readings from Last 1 Encounters:  05/07/22 137/86         Passed - Last Heart Rate in normal range    Pulse Readings from Last 1 Encounters:  05/07/22 (!) 59         Passed - Valid encounter within last 6 months    Recent Outpatient Visits           2 months ago Prediabetes   [name], [name]   5 months ago Essential hypertension   [name], NP   8 months ago Essential hypertension   [place], [name], NP   11 months ago Essential hypertension   [name], [name], NP   1 year ago Mixed hyperlipidemia   [name], [name], [name]       Future Appointments             In 3 months Gildardo Pounds, NP [name]             atorvastatin (LIPITOR) 20 MG tablet 15 tablet 0    Sig: TAKE 1 TABLET BY MOUTH EVERY DAY     Cardiovascular:  Antilipid - Statins Failed - 07/09/2022 11:09 AM      Failed - Lipid Panel in normal range within the last 12 months    Cholesterol, Total  Date Value Ref Range Status  04/24/2021 141 100 - 199 mg/dL Final   LDL Chol Calc (NIH)  Date Value Ref Range Status  04/24/2021 84 0 - 99 mg/dL Final   HDL  Date Value Ref Range Status  04/24/2021 40 >39 mg/dL Final   Triglycerides  Date Value Ref Range Status  04/24/2021 88 0 - 149 mg/dL Final         Passed - Patient is not pregnant      Passed - Valid encounter within last 12 months    Recent Outpatient Visits           2 months ago  Prediabetes   [name], [name]   5 months ago Essential hypertension   [place], NP   8 months ago Essential hypertension   [name], [name], NP   11 months ago Essential hypertension   [name], [name], NP   1 year ago Mixed hyperlipidemia   [name], [name]       Future Appointments             In 3 months Gildardo Pounds, NP [place]

## 2022-07-09 NOTE — Telephone Encounter (Signed)
Pt stated that she needs an emergency Rx for amLODipine (NORVASC) 10 MG tablet and atorvastatin (LIPITOR) 20 MG tablet because she was informed that it will be about 7 days before she receives the delivery of her meds. Pt requests that the prescriptions be sent to [facility] Phone: [phone]  Fax: [phone]

## 2022-08-11 ENCOUNTER — Ambulatory Visit: Payer: Commercial Managed Care - HMO | Admitting: Nurse Practitioner

## 2022-09-29 ENCOUNTER — Ambulatory Visit: Payer: Commercial Managed Care - HMO | Admitting: Internal Medicine

## 2022-10-05 ENCOUNTER — Other Ambulatory Visit: Payer: Self-pay

## 2022-10-05 ENCOUNTER — Other Ambulatory Visit: Payer: Self-pay | Admitting: Pharmacist

## 2022-10-05 DIAGNOSIS — E782 Mixed hyperlipidemia: Secondary | ICD-10-CM

## 2022-10-05 DIAGNOSIS — I10 Essential (primary) hypertension: Secondary | ICD-10-CM

## 2022-10-05 MED ORDER — CLONIDINE HCL 0.1 MG PO TABS
0.1000 mg | ORAL_TABLET | Freq: Two times a day (BID) | ORAL | 0 refills | Status: DC
  Filled 2022-10-05: qty 60, 30d supply, fill #0

## 2022-10-05 MED ORDER — AMLODIPINE BESYLATE 10 MG PO TABS
10.0000 mg | ORAL_TABLET | Freq: Every day | ORAL | 0 refills | Status: DC
  Filled 2022-10-05: qty 30, 30d supply, fill #0

## 2022-10-05 MED ORDER — ATORVASTATIN CALCIUM 20 MG PO TABS
20.0000 mg | ORAL_TABLET | Freq: Every day | ORAL | 0 refills | Status: DC
  Filled 2022-10-05: qty 30, 30d supply, fill #0

## 2022-10-19 ENCOUNTER — Other Ambulatory Visit: Payer: Self-pay

## 2022-10-19 ENCOUNTER — Encounter: Payer: Self-pay | Admitting: Nurse Practitioner

## 2022-10-19 ENCOUNTER — Ambulatory Visit: Payer: BLUE CROSS/BLUE SHIELD | Attending: Nurse Practitioner | Admitting: Nurse Practitioner

## 2022-10-19 VITALS — BP 125/80 | HR 70 | Ht 70.0 in | Wt 208.2 lb

## 2022-10-19 DIAGNOSIS — L301 Dyshidrosis [pompholyx]: Secondary | ICD-10-CM | POA: Diagnosis not present

## 2022-10-19 DIAGNOSIS — Z1231 Encounter for screening mammogram for malignant neoplasm of breast: Secondary | ICD-10-CM

## 2022-10-19 DIAGNOSIS — R7303 Prediabetes: Secondary | ICD-10-CM

## 2022-10-19 DIAGNOSIS — D649 Anemia, unspecified: Secondary | ICD-10-CM

## 2022-10-19 DIAGNOSIS — I10 Essential (primary) hypertension: Secondary | ICD-10-CM

## 2022-10-19 DIAGNOSIS — E782 Mixed hyperlipidemia: Secondary | ICD-10-CM

## 2022-10-19 DIAGNOSIS — M25552 Pain in left hip: Secondary | ICD-10-CM

## 2022-10-19 MED ORDER — AMLODIPINE BESYLATE 10 MG PO TABS
10.0000 mg | ORAL_TABLET | Freq: Every day | ORAL | 1 refills | Status: DC
Start: 2022-10-19 — End: 2023-04-20
  Filled 2022-10-19 – 2022-11-02 (×2): qty 90, 90d supply, fill #0
  Filled 2023-02-02: qty 90, 90d supply, fill #1

## 2022-10-19 MED ORDER — CLONIDINE HCL 0.1 MG PO TABS
0.1000 mg | ORAL_TABLET | Freq: Two times a day (BID) | ORAL | 1 refills | Status: DC
Start: 2022-10-19 — End: 2023-04-20
  Filled 2022-10-19 – 2022-12-01 (×2): qty 180, 90d supply, fill #0
  Filled 2023-02-02 – 2023-03-08 (×2): qty 180, 90d supply, fill #1

## 2022-10-19 MED ORDER — IBUPROFEN 600 MG PO TABS
600.0000 mg | ORAL_TABLET | Freq: Three times a day (TID) | ORAL | 1 refills | Status: DC | PRN
Start: 2022-10-19 — End: 2023-10-19
  Filled 2022-10-19 – 2023-02-02 (×2): qty 60, 20d supply, fill #0

## 2022-10-19 MED ORDER — BETAMETHASONE DIPROPIONATE 0.05 % EX CREA
TOPICAL_CREAM | Freq: Two times a day (BID) | CUTANEOUS | 1 refills | Status: AC
Start: 2022-10-19 — End: ?
  Filled 2022-10-19: qty 15, 30d supply, fill #0
  Filled 2022-12-01: qty 15, 30d supply, fill #1
  Filled 2023-02-02 – 2023-09-02 (×2): qty 15, 30d supply, fill #2

## 2022-10-19 MED ORDER — ATORVASTATIN CALCIUM 20 MG PO TABS
20.0000 mg | ORAL_TABLET | Freq: Every day | ORAL | 1 refills | Status: DC
Start: 2022-10-19 — End: 2023-04-20
  Filled 2022-10-19 – 2022-11-02 (×2): qty 90, 90d supply, fill #0
  Filled 2023-02-02: qty 90, 90d supply, fill #1

## 2022-10-19 NOTE — Progress Notes (Signed)
Assessment & Plan:  Connie Bryant was seen today for rash and hypertension.  Diagnoses and all orders for this visit:  Essential hypertension -     CMP14+EGFR -     amLODipine (NORVASC) 10 MG tablet; Take 1 tablet (10 mg total) by mouth daily. -     cloNIDine (CATAPRES) 0.1 MG tablet; Take 1 tablet (0.1 mg total) by mouth 2 (two) times daily.  Prediabetes -     CMP14+EGFR -     Hemoglobin A1c  Dyshidrotic eczema -     betamethasone dipropionate 0.05 % cream; Apply topically 2 (two) times daily.  Morbid obesity (HCC) Resolved. BMI 29 She is working on weight loss with healthier dietary habits and exercise  Mixed hyperlipidemia -     atorvastatin (LIPITOR) 20 MG tablet; Take 1 tablet (20 mg total) by mouth daily.  Pain of left hip -     ibuprofen (ADVIL) 600 MG tablet; Take 1 tablet (600 mg total) by mouth every 8 (eight) hours as needed for moderate pain or cramping. Take with food.  Anemia, unspecified type -     CBC with Differential  Breast cancer screening by mammogram -     MM 3D SCREENING MAMMOGRAM BILATERAL BREAST; Future    Patient has been counseled on age-appropriate routine health concerns for screening and prevention. These are reviewed and up-to-date. Referrals have been placed accordingly. Immunizations are up-to-date or declined.    Subjective:   Chief Complaint  Patient presents with   Rash   Hypertension   Rash Pertinent negatives include no cough, fever, shortness of breath or vomiting.  Hypertension Pertinent negatives include no blurred vision, chest pain, headaches, malaise/fatigue, palpitations or shortness of breath.   Connie Bryant 57 y.o. female presents to office today for follow up to HTN   She has a past medical history of Anemia, Glaucoma, Hypertension, Obesity, and Pneumonia.   Patient has been counseled on age-appropriate routine health concerns for screening and prevention. These are reviewed and up-to-date. Referrals have been  placed accordingly. Immunizations are up-to-date or declined.     MAMMOGRAM: UTD:  PAP SMEAR: OVERDUE. Schedule for next visit.  COLON cancer screening: UTD   HTN Blood pressure is well controlled with amlodipine 10 mg daily and clonidine 0.1 mg BID. ' BP Readings from Last 3 Encounters:  10/19/22 125/80  05/07/22 137/86  02/06/22 130/85     Prediabetes A1c at goal.  Lab Results  Component Value Date   HGBA1C 6.0 (H) 02/06/2022     Eczema: Patient complains of rash, itching and excoriation of skin between the right middle and right ring finger. Onset of symptoms several weeks ago, and have been gradually worsening since that time.  Risk factors include NONE. Treatment modalities that have been used in the past include: NONE. She has been using alcohol wipes from work as well as washes her hands with soap and water frequently throughout the day due to her job duties.     Review of Systems  Constitutional:  Negative for fever, malaise/fatigue and weight loss.  HENT: Negative.  Negative for nosebleeds.   Eyes: Negative.  Negative for blurred vision, double vision and photophobia.  Respiratory: Negative.  Negative for cough and shortness of breath.   Cardiovascular: Negative.  Negative for chest pain, palpitations and leg swelling.  Gastrointestinal: Negative.  Negative for heartburn, nausea and vomiting.  Musculoskeletal: Negative.  Negative for myalgias.  Skin:  Positive for itching and rash.  Neurological: Negative.  Negative  for dizziness, focal weakness, seizures and headaches.  Psychiatric/Behavioral: Negative.  Negative for suicidal ideas.     Past Medical History:  Diagnosis Date   Anemia    Glaucoma    Hypertension    Obesity    Pneumonia    2008    Past Surgical History:  Procedure Laterality Date   TUBAL LIGATION      Family History  Problem Relation Age of Onset   Diabetes Brother    Heart disease Brother    Heart attack Brother    Diabetes Mother     Kidney disease Mother    Heart attack Mother    Diabetes Sister    Heart attack Sister    Breast cancer Cousin     Social History Reviewed with no changes to be made today.   Outpatient Medications Prior to Visit  Medication Sig Dispense Refill   amLODipine (NORVASC) 10 MG tablet Take 1 tablet (10 mg total) by mouth daily. 30 tablet 0   atorvastatin (LIPITOR) 20 MG tablet Take 1 tablet (20 mg total) by mouth daily. 30 tablet 0   cloNIDine (CATAPRES) 0.1 MG tablet Take 1 tablet (0.1 mg total) by mouth 2 (two) times daily. 60 tablet 0   ibuprofen (ADVIL) 600 MG tablet Take 1 tablet (600 mg total) by mouth every 8 (eight) hours as needed for moderate pain or cramping. Take with food. 60 tablet 1   No facility-administered medications prior to visit.    Allergies  Allergen Reactions   Lisinopril Cough   Oxycodone-Acetaminophen Hives   Tylox [Oxycodone-Acetaminophen] Hives       Objective:    BP 125/80 (BP Location: Right Arm, Patient Position: Sitting, Cuff Size: Large)   Pulse 70   Wt 208 lb 3.2 oz (94.4 kg)   SpO2 98%   BMI 29.87 kg/m  Wt Readings from Last 3 Encounters:  10/19/22 208 lb 3.2 oz (94.4 kg)  05/07/22 215 lb 3.2 oz (97.6 kg)  02/06/22 213 lb 6.4 oz (96.8 kg)    Physical Exam Vitals and nursing note reviewed.  Constitutional:      Appearance: She is well-developed.  HENT:     Head: Normocephalic and atraumatic.  Cardiovascular:     Rate and Rhythm: Normal rate and regular rhythm.     Heart sounds: Normal heart sounds. No murmur heard.    No friction rub. No gallop.  Pulmonary:     Effort: Pulmonary effort is normal. No tachypnea or respiratory distress.     Breath sounds: Normal breath sounds. No decreased breath sounds, wheezing, rhonchi or rales.  Chest:     Chest wall: No tenderness.  Abdominal:     General: Bowel sounds are normal.     Palpations: Abdomen is soft.  Musculoskeletal:        General: Normal range of motion.     Cervical back:  Normal range of motion.  Skin:    General: Skin is warm and dry.     Findings: Rash present. Rash is macular.     Comments: Patchy, excoriated and extremely dry area of skin between right 2nd and 3rd fingers and some of palm.   Neurological:     Mental Status: She is alert and oriented to person, place, and time.     Coordination: Coordination normal.  Psychiatric:        Behavior: Behavior normal. Behavior is cooperative.        Thought Content: Thought content normal.  Judgment: Judgment normal.          Patient has been counseled extensively about nutrition and exercise as well as the importance of adherence with medications and regular follow-up. The patient was given clear instructions to go to ER or return to medical center if symptoms don't improve, worsen or new problems develop. The patient verbalized understanding.   Follow-up: Return in about 6 months (around 04/20/2023).   Claiborne Rigg, FNP-BC [facility] [place], [place] [phone]   10/19/2022, 4:28 PM

## 2022-10-20 ENCOUNTER — Other Ambulatory Visit: Payer: Self-pay

## 2022-10-20 LAB — CMP14+EGFR
ALT: 15 IU/L (ref 0–32)
AST: 15 IU/L (ref 0–40)
Albumin/Globulin Ratio: 1.1 — ABNORMAL LOW (ref 1.2–2.2)
Albumin: 3.9 g/dL (ref 3.8–4.9)
Alkaline Phosphatase: 113 IU/L (ref 44–121)
BUN/Creatinine Ratio: 15 (ref 9–23)
BUN: 11 mg/dL (ref 6–24)
Bilirubin Total: 0.2 mg/dL (ref 0.0–1.2)
CO2: 24 mmol/L (ref 20–29)
Calcium: 10 mg/dL (ref 8.7–10.2)
Chloride: 102 mmol/L (ref 96–106)
Creatinine, Ser: 0.74 mg/dL (ref 0.57–1.00)
Globulin, Total: 3.5 g/dL (ref 1.5–4.5)
Glucose: 81 mg/dL (ref 70–99)
Potassium: 4.5 mmol/L (ref 3.5–5.2)
Sodium: 138 mmol/L (ref 134–144)
Total Protein: 7.4 g/dL (ref 6.0–8.5)
eGFR: 94 mL/min/{1.73_m2} (ref >59)

## 2022-10-20 LAB — CBC WITH DIFFERENTIAL/PLATELET
Basophils Absolute: 0.1 10*3/uL (ref 0.0–0.2)
Basos: 1 %
EOS (ABSOLUTE): 0.4 10*3/uL (ref 0.0–0.4)
Eos: 7 %
Hematocrit: 41.1 % (ref 34.0–46.6)
Hemoglobin: 13.1 g/dL (ref 11.1–15.9)
Immature Grans (Abs): 0 10*3/uL (ref 0.0–0.1)
Immature Granulocytes: 0 %
Lymphocytes Absolute: 1.7 10*3/uL (ref 0.7–3.1)
Lymphs: 28 %
MCH: 27.1 pg (ref 26.6–33.0)
MCHC: 31.9 g/dL (ref 31.5–35.7)
MCV: 85 fL (ref 79–97)
Monocytes Absolute: 0.7 10*3/uL (ref 0.1–0.9)
Monocytes: 11 %
Neutrophils Absolute: 3.2 10*3/uL (ref 1.4–7.0)
Neutrophils: 53 %
Platelets: 314 10*3/uL (ref 150–450)
RBC: 4.83 x10E6/uL (ref 3.77–5.28)
RDW: 13.2 % (ref 11.7–15.4)
WBC: 6 10*3/uL (ref 3.4–10.8)

## 2022-10-20 LAB — HEMOGLOBIN A1C
Est. average glucose Bld gHb Est-mCnc: 126 mg/dL
Hgb A1c MFr Bld: 6 % — ABNORMAL HIGH (ref 4.8–5.6)

## 2022-10-21 ENCOUNTER — Other Ambulatory Visit: Payer: Self-pay

## 2022-11-02 ENCOUNTER — Other Ambulatory Visit: Payer: Self-pay

## 2022-11-04 ENCOUNTER — Other Ambulatory Visit: Payer: Self-pay

## 2022-12-01 ENCOUNTER — Other Ambulatory Visit: Payer: Self-pay

## 2022-12-02 ENCOUNTER — Other Ambulatory Visit: Payer: Self-pay

## 2022-12-31 ENCOUNTER — Ambulatory Visit
Admission: RE | Admit: 2022-12-31 | Discharge: 2022-12-31 | Disposition: A | Discharge status: Home / Self Care | Payer: 59 | Source: Ambulatory Visit | Attending: Nurse Practitioner | Admitting: Nurse Practitioner

## 2022-12-31 DIAGNOSIS — Z1231 Encounter for screening mammogram for malignant neoplasm of breast: Secondary | ICD-10-CM

## 2023-02-02 ENCOUNTER — Other Ambulatory Visit: Payer: Self-pay

## 2023-02-03 ENCOUNTER — Other Ambulatory Visit: Payer: Self-pay

## 2023-03-08 ENCOUNTER — Other Ambulatory Visit: Payer: Self-pay

## 2023-03-09 ENCOUNTER — Other Ambulatory Visit: Payer: Self-pay

## 2023-04-20 ENCOUNTER — Other Ambulatory Visit (HOSPITAL_COMMUNITY)
Admission: RE | Admit: 2023-04-20 | Discharge: 2023-04-20 | Disposition: A | Discharge status: Home / Self Care | Payer: Self-pay | Source: Ambulatory Visit | Attending: Nurse Practitioner | Admitting: Nurse Practitioner

## 2023-04-20 ENCOUNTER — Encounter: Payer: Self-pay | Admitting: Nurse Practitioner

## 2023-04-20 ENCOUNTER — Ambulatory Visit: Payer: 59 | Attending: Nurse Practitioner | Admitting: Nurse Practitioner

## 2023-04-20 ENCOUNTER — Other Ambulatory Visit: Payer: Self-pay

## 2023-04-20 VITALS — BP 143/83 | HR 73 | Ht 70.0 in | Wt 203.6 lb

## 2023-04-20 DIAGNOSIS — Z124 Encounter for screening for malignant neoplasm of cervix: Secondary | ICD-10-CM

## 2023-04-20 DIAGNOSIS — R7303 Prediabetes: Secondary | ICD-10-CM

## 2023-04-20 DIAGNOSIS — Z23 Encounter for immunization: Secondary | ICD-10-CM

## 2023-04-20 DIAGNOSIS — Z1211 Encounter for screening for malignant neoplasm of colon: Secondary | ICD-10-CM

## 2023-04-20 DIAGNOSIS — E782 Mixed hyperlipidemia: Secondary | ICD-10-CM

## 2023-04-20 DIAGNOSIS — I10 Essential (primary) hypertension: Secondary | ICD-10-CM

## 2023-04-20 MED ORDER — AMLODIPINE BESYLATE 10 MG PO TABS
10.0000 mg | ORAL_TABLET | Freq: Every day | ORAL | 1 refills | Status: DC
  Filled 2023-04-20 – 2023-04-27 (×2): qty 90, 90d supply, fill #0
  Filled 2023-08-03: qty 30, 30d supply, fill #1
  Filled 2023-09-02: qty 30, 30d supply, fill #2
  Filled 2023-10-04: qty 30, 30d supply, fill #3

## 2023-04-20 MED ORDER — CLONIDINE HCL 0.1 MG PO TABS
0.1000 mg | ORAL_TABLET | Freq: Two times a day (BID) | ORAL | 1 refills | Status: DC
  Filled 2023-04-20: qty 180, 90d supply, fill #0
  Filled 2023-06-10: qty 60, 30d supply, fill #0
  Filled 2023-07-08: qty 60, 30d supply, fill #1
  Filled 2023-08-03: qty 60, 30d supply, fill #2
  Filled 2023-09-02: qty 60, 30d supply, fill #3
  Filled 2023-10-14: qty 60, 30d supply, fill #4

## 2023-04-20 MED ORDER — ATORVASTATIN CALCIUM 20 MG PO TABS
20.0000 mg | ORAL_TABLET | Freq: Every day | ORAL | 1 refills | Status: DC
  Filled 2023-04-20 – 2023-04-27 (×2): qty 90, 90d supply, fill #0
  Filled 2023-08-03: qty 30, 30d supply, fill #1
  Filled 2023-08-13 – 2023-09-02 (×2): qty 30, 30d supply, fill #2
  Filled 2023-10-04: qty 30, 30d supply, fill #3

## 2023-04-20 NOTE — Progress Notes (Signed)
Assessment & Plan:  Connie Bryant was seen today for gynecologic exam.  Diagnoses and all orders for this visit:  Encounter for Papanicolaou smear of cervix -     Cervicovaginal ancillary only -     Cytology - PAP  Essential hypertension Slightly elevated today.  -     amLODipine (NORVASC) 10 MG tablet; Take 1 tablet (10 mg total) by mouth daily. -     cloNIDine (CATAPRES) 0.1 MG tablet; Take 1 tablet (0.1 mg total) by mouth 2 (two) times daily. -     CMP14+EGFR Continue all antihypertensives as prescribed.  Reminded to bring in blood pressure log for follow  up appointment.  RECOMMENDATIONS: DASH/Mediterranean Diets are healthier choices for HTN.    Mixed hyperlipidemia -     atorvastatin (LIPITOR) 20 MG tablet; Take 1 tablet (20 mg total) by mouth daily. INSTRUCTIONS: Work on a low fat, heart healthy diet and participate in regular aerobic exercise program by working out at least 150 minutes per week; 5 days a week-30 minutes per day. Avoid red meat/beef/steak,  fried foods. junk foods, sodas, sugary drinks, unhealthy snacking, alcohol and smoking.  Drink at least 80 oz of water per day and monitor your carbohydrate intake daily.    Colon cancer screening -     Fecal occult blood, imunochemical([facility])  Prediabetes -     Hemoglobin A1c    Patient has been counseled on age-appropriate routine health concerns for screening and prevention. These are reviewed and up-to-date. Referrals have been placed accordingly. Immunizations are up-to-date or declined.    Subjective:   Chief Complaint  Patient presents with   Gynecologic Exam    Connie Bryant 57 y.o. female presents to office today for well woman exam  Patient has been counseled on age-appropriate routine health concerns for screening and prevention. These are reviewed and up-to-date. Referrals have been placed accordingly. Immunizations are up-to-date or declined.   MAMMOGRAM: UTD COLON CANCER SCREENING: UTD       Review of Systems  Constitutional: Negative.  Negative for chills, fever, malaise/fatigue and weight loss.  Respiratory: Negative.  Negative for cough, shortness of breath and wheezing.   Cardiovascular: Negative.  Negative for chest pain, orthopnea and leg swelling.  Gastrointestinal:  Negative for abdominal pain.  Genitourinary: Negative.  Negative for flank pain.  Skin: Negative.  Negative for rash.  Psychiatric/Behavioral:  Negative for suicidal ideas.     Past Medical History:  Diagnosis Date   Anemia    Glaucoma    Hypertension    Obesity    Pneumonia    2008    Past Surgical History:  Procedure Laterality Date   TUBAL LIGATION      Family History  Problem Relation Age of Onset   Diabetes Brother    Heart disease Brother    Heart attack Brother    Diabetes Mother    Kidney disease Mother    Heart attack Mother    Diabetes Sister    Heart attack Sister    Breast cancer Cousin     Social History Reviewed with no changes to be made today.   Outpatient Medications Prior to Visit  Medication Sig Dispense Refill   ibuprofen (ADVIL) 600 MG tablet Take 1 tablet (600 mg total) by mouth every 8 (eight) hours as needed for moderate pain or cramping. Take with food. 60 tablet 1   amLODipine (NORVASC) 10 MG tablet Take 1 tablet (10 mg total) by mouth daily. 90 tablet 1  atorvastatin (LIPITOR) 20 MG tablet Take 1 tablet (20 mg total) by mouth daily. 90 tablet 1   cloNIDine (CATAPRES) 0.1 MG tablet Take 1 tablet (0.1 mg total) by mouth 2 (two) times daily. 180 tablet 1   betamethasone dipropionate 0.05 % cream Apply topically 2 (two) times daily. (Patient not taking: Reported on 04/20/2023) 60 g 1   No facility-administered medications prior to visit.    Allergies  Allergen Reactions   Lisinopril Cough   Oxycodone-Acetaminophen Hives   Tylox [Oxycodone-Acetaminophen] Hives       Objective:    BP (!) 143/83 (BP Location: Left Arm, Patient Position: Sitting,  Cuff Size: Normal)   Pulse 73   Ht 5\' 10"  (1.778 m)   Wt 203 lb 9.6 oz (92.4 kg)   LMP  (LMP Unknown)   SpO2 100%   BMI 29.21 kg/m  Wt Readings from Last 3 Encounters:  04/20/23 203 lb 9.6 oz (92.4 kg)  10/19/22 208 lb 3.2 oz (94.4 kg)  05/07/22 215 lb 3.2 oz (97.6 kg)    Physical Exam Exam conducted with a chaperone present.  Constitutional:      Appearance: She is well-developed.  HENT:     Head: Normocephalic.  Cardiovascular:     Rate and Rhythm: Normal rate and regular rhythm.     Heart sounds: Normal heart sounds.  Pulmonary:     Effort: Pulmonary effort is normal.     Breath sounds: Normal breath sounds.  Abdominal:     General: Bowel sounds are normal.     Palpations: Abdomen is soft.     Hernia: There is no hernia in the left inguinal area.  Genitourinary:    Exam position: Lithotomy position.     Labia:        Right: No rash, tenderness, lesion or injury.        Left: No rash, tenderness, lesion or injury.      Vagina: Normal. No signs of injury and foreign body. No vaginal discharge, erythema, tenderness or bleeding.     Cervix: Normal.     Uterus: Not deviated and not enlarged.      Adnexa:        Right: No mass, tenderness or fullness.         Left: No mass, tenderness or fullness.       Rectum: Normal. No external hemorrhoid.  Lymphadenopathy:     Lower Body: No right inguinal adenopathy. No left inguinal adenopathy.  Skin:    General: Skin is warm and dry.  Neurological:     Mental Status: She is alert and oriented to person, place, and time.  Psychiatric:        Behavior: Behavior normal.        Thought Content: Thought content normal.        Judgment: Judgment normal.          Patient has been counseled extensively about nutrition and exercise as well as the importance of adherence with medications and regular follow-up. The patient was given clear instructions to go to ER or return to medical center if symptoms don't improve, worsen or new  problems develop. The patient verbalized understanding.   Follow-up: Return in about 6 months (around 10/19/2023).   Claiborne Rigg, FNP-BC [facility] and [facility] [place], [place] [phone]   04/20/2023, 4:12 PM

## 2023-04-21 LAB — CMP14+EGFR
ALT: 15 [IU]/L (ref 0–32)
AST: 15 [IU]/L (ref 0–40)
Albumin: 4.2 g/dL (ref 3.8–4.9)
Alkaline Phosphatase: 109 [IU]/L (ref 44–121)
BUN/Creatinine Ratio: 9 (ref 9–23)
BUN: 8 mg/dL (ref 6–24)
Bilirubin Total: 0.3 mg/dL (ref 0.0–1.2)
CO2: 25 mmol/L (ref 20–29)
Calcium: 10.5 mg/dL — ABNORMAL HIGH (ref 8.7–10.2)
Chloride: 105 mmol/L (ref 96–106)
Creatinine, Ser: 0.86 mg/dL (ref 0.57–1.00)
Globulin, Total: 3.3 g/dL (ref 1.5–4.5)
Glucose: 84 mg/dL (ref 70–99)
Potassium: 4.3 mmol/L (ref 3.5–5.2)
Sodium: 142 mmol/L (ref 134–144)
Total Protein: 7.5 g/dL (ref 6.0–8.5)
eGFR: 78 mL/min/{1.73_m2} (ref >59)

## 2023-04-21 LAB — HEMOGLOBIN A1C
Est. average glucose Bld gHb Est-mCnc: 126 mg/dL
Hgb A1c MFr Bld: 6 % — ABNORMAL HIGH (ref 4.8–5.6)

## 2023-04-22 LAB — CYTOLOGY - PAP
Chlamydia: NEGATIVE
Comment: NEGATIVE
Comment: NEGATIVE
Comment: NORMAL
Diagnosis: NEGATIVE
High risk HPV: NEGATIVE
Neisseria Gonorrhea: NEGATIVE

## 2023-04-22 LAB — CERVICOVAGINAL ANCILLARY ONLY
Bacterial Vaginitis (gardnerella): POSITIVE — AB
Candida Glabrata: NEGATIVE
Candida Vaginitis: NEGATIVE
Chlamydia: NEGATIVE
Comment: NEGATIVE
Comment: NEGATIVE
Comment: NEGATIVE
Comment: NEGATIVE
Comment: NEGATIVE
Comment: NORMAL
Neisseria Gonorrhea: NEGATIVE
Trichomonas: NEGATIVE

## 2023-04-26 ENCOUNTER — Other Ambulatory Visit: Payer: Self-pay | Admitting: Nurse Practitioner

## 2023-04-26 DIAGNOSIS — B9689 Other specified bacterial agents as the cause of diseases classified elsewhere: Secondary | ICD-10-CM

## 2023-04-26 MED ORDER — METRONIDAZOLE 500 MG PO TABS
500.0000 mg | ORAL_TABLET | Freq: Two times a day (BID) | ORAL | 0 refills | Status: AC
  Filled 2023-04-26: qty 14, 7d supply, fill #0

## 2023-04-27 ENCOUNTER — Other Ambulatory Visit: Payer: Self-pay

## 2023-04-28 ENCOUNTER — Other Ambulatory Visit: Payer: Self-pay

## 2023-06-10 ENCOUNTER — Other Ambulatory Visit: Payer: Self-pay

## 2023-07-12 ENCOUNTER — Other Ambulatory Visit: Payer: Self-pay

## 2023-08-03 ENCOUNTER — Other Ambulatory Visit: Payer: Self-pay

## 2023-08-04 ENCOUNTER — Other Ambulatory Visit: Payer: Self-pay

## 2023-08-13 ENCOUNTER — Other Ambulatory Visit: Payer: Self-pay

## 2023-09-02 ENCOUNTER — Other Ambulatory Visit (HOSPITAL_BASED_OUTPATIENT_CLINIC_OR_DEPARTMENT_OTHER): Payer: Self-pay

## 2023-09-02 ENCOUNTER — Other Ambulatory Visit: Payer: Self-pay

## 2023-09-02 ENCOUNTER — Other Ambulatory Visit (HOSPITAL_COMMUNITY): Payer: Self-pay

## 2023-09-03 ENCOUNTER — Other Ambulatory Visit: Payer: Self-pay

## 2023-10-04 ENCOUNTER — Other Ambulatory Visit: Payer: Self-pay

## 2023-10-05 ENCOUNTER — Other Ambulatory Visit: Payer: Self-pay

## 2023-10-14 ENCOUNTER — Other Ambulatory Visit: Payer: Self-pay

## 2023-10-15 ENCOUNTER — Other Ambulatory Visit: Payer: Self-pay

## 2023-10-19 ENCOUNTER — Encounter: Payer: Self-pay | Admitting: Nurse Practitioner

## 2023-10-19 ENCOUNTER — Ambulatory Visit: Payer: Self-pay | Attending: Nurse Practitioner | Admitting: Nurse Practitioner

## 2023-10-19 ENCOUNTER — Other Ambulatory Visit: Payer: Self-pay

## 2023-10-19 VITALS — BP 114/76 | HR 91 | Resp 19 | Ht 70.0 in | Wt 200.8 lb

## 2023-10-19 DIAGNOSIS — R7303 Prediabetes: Secondary | ICD-10-CM | POA: Diagnosis not present

## 2023-10-19 DIAGNOSIS — E782 Mixed hyperlipidemia: Secondary | ICD-10-CM

## 2023-10-19 DIAGNOSIS — Z1231 Encounter for screening mammogram for malignant neoplasm of breast: Secondary | ICD-10-CM

## 2023-10-19 DIAGNOSIS — I10 Essential (primary) hypertension: Secondary | ICD-10-CM

## 2023-10-19 DIAGNOSIS — R7989 Other specified abnormal findings of blood chemistry: Secondary | ICD-10-CM | POA: Diagnosis not present

## 2023-10-19 DIAGNOSIS — M25552 Pain in left hip: Secondary | ICD-10-CM

## 2023-10-19 MED ORDER — AMLODIPINE BESYLATE 10 MG PO TABS
10.0000 mg | ORAL_TABLET | Freq: Every day | ORAL | 6 refills | Status: DC
  Filled 2023-10-19 – 2023-11-03 (×2): qty 30, 30d supply, fill #0
  Filled 2023-12-02: qty 30, 30d supply, fill #1
  Filled 2023-12-29: qty 30, 30d supply, fill #2
  Filled 2024-01-31: qty 30, 30d supply, fill #3
  Filled 2024-02-29: qty 30, 30d supply, fill #4
  Filled 2024-03-30: qty 30, 30d supply, fill #5

## 2023-10-19 MED ORDER — CLONIDINE HCL 0.1 MG PO TABS
0.1000 mg | ORAL_TABLET | Freq: Two times a day (BID) | ORAL | 6 refills | Status: DC
  Filled 2023-10-19 – 2023-11-03 (×2): qty 30, 15d supply, fill #0

## 2023-10-19 MED ORDER — ATORVASTATIN CALCIUM 20 MG PO TABS
20.0000 mg | ORAL_TABLET | Freq: Every day | ORAL | 6 refills | Status: DC
  Filled 2023-10-19 – 2023-11-03 (×2): qty 30, 30d supply, fill #0
  Filled 2023-12-02: qty 30, 30d supply, fill #1
  Filled 2023-12-29 (×2): qty 30, 30d supply, fill #2
  Filled 2024-01-31: qty 30, 30d supply, fill #3
  Filled 2024-02-29: qty 30, 30d supply, fill #4
  Filled 2024-03-30: qty 30, 30d supply, fill #5

## 2023-10-19 MED ORDER — IBUPROFEN 600 MG PO TABS
600.0000 mg | ORAL_TABLET | Freq: Three times a day (TID) | ORAL | 1 refills | Status: AC | PRN
  Filled 2023-10-19 – 2024-05-03 (×3): qty 60, 20d supply, fill #0
  Filled 2024-05-30: qty 60, 20d supply, fill #1

## 2023-10-19 NOTE — Progress Notes (Signed)
 Assessment & Plan:   Connie Bryant was seen today for hypertension.  Diagnoses and all orders for this visit:  Primary hypertension Blood pressure is controlled.  -     CMP14+EGFR -     amLODipine  (NORVASC ) 10 MG tablet; Take 1 tablet (10 mg total) by mouth daily. -     cloNIDine  (CATAPRES ) 0.1 MG tablet; Take 1 tablet (0.1 mg total) by mouth 2 (two) times daily.  Mixed hyperlipidemia -     atorvastatin  (LIPITOR) 20 MG tablet; Take 1 tablet (20 mg total) by mouth daily. INSTRUCTIONS: Work on a low fat, heart healthy diet and participate in regular aerobic exercise program by working out at least 150 minutes per week; 5 days a week-30 minutes per day. Avoid red meat/beef/steak,  fried foods. junk foods, sodas, sugary drinks, unhealthy snacking, alcohol and smoking.  Drink at least 80 oz of water per day and monitor your carbohydrate intake daily.    Prediabetes -     Hemoglobin A1c  Abnormal CBC -     CBC with Differential    Patient has been counseled on age-appropriate routine health concerns for screening and prevention. These are reviewed and up-to-date. Referrals have been placed accordingly. Immunizations are up-to-date or declined.    Subjective:   Chief Complaint  Patient presents with   Hypertension    Connie Bryant 58 y.o. female presents to office today for follow up to HTN   She has a past medical history of Anemia, Glaucoma, Hypertension, Obesity, and Pneumonia.   Blood pressure is well controlled. She is taking clonidine  0.1 mg BID and amlodipine  10 mg daily.  BP Readings from Last 3 Encounters:  10/19/23 114/76  04/20/23 (!) 143/83  10/19/22 125/80     She fell last month and had been experiencing left shoulder and hip pain which was relieved with ibuprofen . She did not hit her head. Pain has somewhat subsided.    Review of Systems  Constitutional:  Negative for fever, malaise/fatigue and weight loss.  HENT: Negative.  Negative for nosebleeds.   Eyes:  Negative.  Negative for blurred vision, double vision and photophobia.  Respiratory: Negative.  Negative for cough and shortness of breath.   Cardiovascular: Negative.  Negative for chest pain, palpitations and leg swelling.  Gastrointestinal: Negative.  Negative for heartburn, nausea and vomiting.  Musculoskeletal: Negative.  Negative for myalgias.  Neurological: Negative.  Negative for dizziness, focal weakness, seizures and headaches.  Psychiatric/Behavioral: Negative.  Negative for suicidal ideas.     Past Medical History:  Diagnosis Date   Anemia    Glaucoma    Hypertension    Obesity    Pneumonia    2008    Past Surgical History:  Procedure Laterality Date   TUBAL LIGATION      Family History  Problem Relation Age of Onset   Diabetes Brother    Heart disease Brother    Heart attack Brother    Diabetes Mother    Kidney disease Mother    Heart attack Mother    Diabetes Sister    Heart attack Sister    Breast cancer Cousin     Social History Reviewed with no changes to be made today.   Outpatient Medications Prior to Visit  Medication Sig Dispense Refill   betamethasone  dipropionate 0.05 % cream Apply topically 2 (two) times daily. 60 g 1   amLODipine  (NORVASC ) 10 MG tablet Take 1 tablet (10 mg total) by mouth daily. 90 tablet 1  atorvastatin  (LIPITOR) 20 MG tablet Take 1 tablet (20 mg total) by mouth daily. 90 tablet 1   cloNIDine  (CATAPRES ) 0.1 MG tablet Take 1 tablet (0.1 mg total) by mouth 2 (two) times daily. 180 tablet 1   ibuprofen  (ADVIL ) 600 MG tablet Take 1 tablet (600 mg total) by mouth every 8 (eight) hours as needed for moderate pain or cramping. Take with food. 60 tablet 1   No facility-administered medications prior to visit.    Allergies  Allergen Reactions   Lisinopril  Cough   Oxycodone-Acetaminophen Hives   Tylox [Oxycodone-Acetaminophen] Hives       Objective:    BP 114/76 (BP Location: Left Arm, Patient Position: Sitting, Cuff Size:  Normal)   Pulse 91   Resp 19   Ht 5\' 10"  (1.778 m)   Wt 200 lb 12.8 oz (91.1 kg)   LMP  (LMP Unknown)   SpO2 98%   BMI 28.81 kg/m  Wt Readings from Last 3 Encounters:  10/19/23 200 lb 12.8 oz (91.1 kg)  04/20/23 203 lb 9.6 oz (92.4 kg)  10/19/22 208 lb 3.2 oz (94.4 kg)    Physical Exam Vitals and nursing note reviewed.  Constitutional:      Appearance: She is well-developed.  HENT:     Head: Normocephalic and atraumatic.  Cardiovascular:     Rate and Rhythm: Normal rate and regular rhythm.     Heart sounds: Normal heart sounds. No murmur heard.    No friction rub. No gallop.  Pulmonary:     Effort: Pulmonary effort is normal. No tachypnea or respiratory distress.     Breath sounds: Normal breath sounds. No decreased breath sounds, wheezing, rhonchi or rales.  Chest:     Chest wall: No tenderness.  Abdominal:     General: Bowel sounds are normal.     Palpations: Abdomen is soft.  Musculoskeletal:        General: Normal range of motion.     Cervical back: Normal range of motion.  Skin:    General: Skin is warm and dry.  Neurological:     Mental Status: She is alert and oriented to person, place, and time.     Coordination: Coordination normal.  Psychiatric:        Behavior: Behavior normal. Behavior is cooperative.        Thought Content: Thought content normal.        Judgment: Judgment normal.          Patient has been counseled extensively about nutrition and exercise as well as the importance of adherence with medications and regular follow-up. The patient was given clear instructions to go to ER or return to medical center if symptoms don't improve, worsen or new problems develop. The patient verbalized understanding.   Follow-up: Return in about 6 months (around 04/19/2024).   Collins Dean, FNP-BC [facility] and [facility]ness [place], [place] [phone]   10/19/2023, 2:49 PM

## 2023-10-20 ENCOUNTER — Ambulatory Visit: Payer: Self-pay | Admitting: Nurse Practitioner

## 2023-10-20 LAB — CMP14+EGFR
ALT: 13 IU/L (ref 0–32)
AST: 14 IU/L (ref 0–40)
Albumin: 4.1 g/dL (ref 3.8–4.9)
Alkaline Phosphatase: 105 IU/L (ref 44–121)
BUN/Creatinine Ratio: 12 (ref 9–23)
BUN: 11 mg/dL (ref 6–24)
Bilirubin Total: 0.3 mg/dL (ref 0.0–1.2)
CO2: 23 mmol/L (ref 20–29)
Calcium: 10.1 mg/dL (ref 8.7–10.2)
Chloride: 103 mmol/L (ref 96–106)
Creatinine, Ser: 0.94 mg/dL (ref 0.57–1.00)
Globulin, Total: 3.4 g/dL (ref 1.5–4.5)
Glucose: 95 mg/dL (ref 70–99)
Potassium: 4.7 mmol/L (ref 3.5–5.2)
Sodium: 138 mmol/L (ref 134–144)
Total Protein: 7.5 g/dL (ref 6.0–8.5)
eGFR: 70 mL/min/{1.73_m2} (ref >59)

## 2023-10-20 LAB — CBC WITH DIFFERENTIAL/PLATELET
Basophils Absolute: 0.1 10*3/uL (ref 0.0–0.2)
Basos: 1 %
EOS (ABSOLUTE): 0.3 10*3/uL (ref 0.0–0.4)
Eos: 5 %
Hematocrit: 43 % (ref 34.0–46.6)
Hemoglobin: 14.1 g/dL (ref 11.1–15.9)
Immature Grans (Abs): 0 10*3/uL (ref 0.0–0.1)
Immature Granulocytes: 0 %
Lymphocytes Absolute: 1.5 10*3/uL (ref 0.7–3.1)
Lymphs: 27 %
MCH: 28.5 pg (ref 26.6–33.0)
MCHC: 32.8 g/dL (ref 31.5–35.7)
MCV: 87 fL (ref 79–97)
Monocytes Absolute: 0.6 10*3/uL (ref 0.1–0.9)
Monocytes: 11 %
Neutrophils Absolute: 3.1 10*3/uL (ref 1.4–7.0)
Neutrophils: 56 %
Platelets: 261 10*3/uL (ref 150–450)
RBC: 4.94 x10E6/uL (ref 3.77–5.28)
RDW: 13.1 % (ref 11.7–15.4)
WBC: 5.5 10*3/uL (ref 3.4–10.8)

## 2023-10-20 LAB — HEMOGLOBIN A1C
Est. average glucose Bld gHb Est-mCnc: 117 mg/dL
Hgb A1c MFr Bld: 5.7 % — ABNORMAL HIGH (ref 4.8–5.6)

## 2023-10-29 ENCOUNTER — Other Ambulatory Visit: Payer: Self-pay

## 2023-11-03 ENCOUNTER — Other Ambulatory Visit: Payer: Self-pay

## 2023-11-04 ENCOUNTER — Other Ambulatory Visit: Payer: Self-pay

## 2023-11-04 ENCOUNTER — Telehealth: Payer: Self-pay | Admitting: Nurse Practitioner

## 2023-11-04 DIAGNOSIS — I10 Essential (primary) hypertension: Secondary | ICD-10-CM

## 2023-11-04 MED ORDER — CLONIDINE HCL 0.1 MG PO TABS
0.1000 mg | ORAL_TABLET | Freq: Two times a day (BID) | ORAL | 3 refills | Status: DC
  Filled 2023-11-04 – 2023-12-02 (×2): qty 60, 30d supply, fill #0
  Filled 2023-12-29 (×2): qty 60, 30d supply, fill #1
  Filled 2024-01-31: qty 60, 30d supply, fill #2
  Filled 2024-02-29: qty 60, 30d supply, fill #3

## 2023-11-04 NOTE — Telephone Encounter (Unsigned)
 Copied from CRM [id number]. Topic: Clinical - Prescription Issue >> Nov 04, 2023  4:14 PM Phil Braun wrote: Reason for CRM:   Ref:cloNIDine  (CATAPRES ) 0.1 MG tablet  The quantity of 30 was sent in to the drug store and the rx states to take 2 daily. She will not have enough for the month. Please send in the additional medication that is needed for the month. Prior prescription qty's have been 60.  Please advise.

## 2023-11-04 NOTE — Telephone Encounter (Signed)
 New RX sent

## 2023-11-04 NOTE — Telephone Encounter (Signed)
 FYI

## 2023-12-02 ENCOUNTER — Other Ambulatory Visit: Payer: Self-pay

## 2023-12-03 ENCOUNTER — Other Ambulatory Visit: Payer: Self-pay

## 2023-12-29 ENCOUNTER — Other Ambulatory Visit: Payer: Self-pay

## 2023-12-30 ENCOUNTER — Other Ambulatory Visit: Payer: Self-pay

## 2024-01-03 ENCOUNTER — Inpatient Hospital Stay
Admission: RE | Admit: 2024-01-03 | Discharge: 2024-01-03 | Discharge status: Home / Self Care | Source: Ambulatory Visit | Attending: Nurse Practitioner

## 2024-01-03 DIAGNOSIS — Z1231 Encounter for screening mammogram for malignant neoplasm of breast: Secondary | ICD-10-CM

## 2024-01-31 ENCOUNTER — Other Ambulatory Visit: Payer: Self-pay

## 2024-02-29 ENCOUNTER — Other Ambulatory Visit: Payer: Self-pay

## 2024-03-21 ENCOUNTER — Other Ambulatory Visit (HOSPITAL_BASED_OUTPATIENT_CLINIC_OR_DEPARTMENT_OTHER): Payer: Self-pay

## 2024-03-30 ENCOUNTER — Other Ambulatory Visit: Payer: Self-pay

## 2024-03-30 ENCOUNTER — Other Ambulatory Visit: Payer: Self-pay | Admitting: Nurse Practitioner

## 2024-03-30 DIAGNOSIS — I10 Essential (primary) hypertension: Secondary | ICD-10-CM

## 2024-03-30 MED ORDER — CLONIDINE HCL 0.1 MG PO TABS
0.1000 mg | ORAL_TABLET | Freq: Two times a day (BID) | ORAL | 3 refills | Status: DC
  Filled 2024-03-30: qty 60, 30d supply, fill #0

## 2024-03-31 ENCOUNTER — Other Ambulatory Visit: Payer: Self-pay

## 2024-04-19 ENCOUNTER — Encounter: Payer: Self-pay | Admitting: Nurse Practitioner

## 2024-04-19 ENCOUNTER — Ambulatory Visit: Attending: Nurse Practitioner | Admitting: Nurse Practitioner

## 2024-04-19 ENCOUNTER — Other Ambulatory Visit: Payer: Self-pay

## 2024-04-19 ENCOUNTER — Ambulatory Visit: Admitting: Nurse Practitioner

## 2024-04-19 VITALS — BP 132/84 | HR 73 | Resp 19 | Ht 70.0 in | Wt 196.8 lb

## 2024-04-19 DIAGNOSIS — Z1211 Encounter for screening for malignant neoplasm of colon: Secondary | ICD-10-CM | POA: Diagnosis not present

## 2024-04-19 DIAGNOSIS — Z23 Encounter for immunization: Secondary | ICD-10-CM

## 2024-04-19 DIAGNOSIS — I10 Essential (primary) hypertension: Secondary | ICD-10-CM | POA: Diagnosis not present

## 2024-04-19 DIAGNOSIS — E782 Mixed hyperlipidemia: Secondary | ICD-10-CM

## 2024-04-19 MED ORDER — AMLODIPINE BESYLATE 10 MG PO TABS
10.0000 mg | ORAL_TABLET | Freq: Every day | ORAL | 6 refills | Status: AC
  Filled 2024-04-19 – 2024-05-02 (×2): qty 30, 30d supply, fill #0
  Filled 2024-05-30: qty 30, 30d supply, fill #1

## 2024-04-19 MED ORDER — ATORVASTATIN CALCIUM 20 MG PO TABS
20.0000 mg | ORAL_TABLET | Freq: Every day | ORAL | 6 refills | Status: AC
  Filled 2024-04-19 – 2024-05-02 (×2): qty 30, 30d supply, fill #0
  Filled 2024-05-30: qty 30, 30d supply, fill #1

## 2024-04-19 MED ORDER — CLONIDINE HCL 0.1 MG PO TABS
0.1000 mg | ORAL_TABLET | Freq: Two times a day (BID) | ORAL | 3 refills | Status: AC
  Filled 2024-04-19 – 2024-05-02 (×2): qty 60, 30d supply, fill #0
  Filled 2024-05-30: qty 60, 30d supply, fill #1

## 2024-04-19 NOTE — Progress Notes (Signed)
 Assessment & Plan:  [name] was seen today for hypertension.  Diagnoses and all orders for this visit:  Primary hypertension -     amLODipine  (NORVASC ) 10 MG tablet; Take 1 tablet (10 mg total) by mouth daily. -     cloNIDine  (CATAPRES ) 0.1 MG tablet; Take 1 tablet (0.1 mg total) by mouth 2 (two) times daily. Continue all antihypertensives as prescribed.  Reminded to bring in blood pressure log for follow  up appointment.  RECOMMENDATIONS: DASH/Mediterranean Diets are healthier choices for HTN.     Colon cancer screening -     Ambulatory referral to Gastroenterology  Mixed hyperlipidemia -     atorvastatin  (LIPITOR) 20 MG tablet; Take 1 tablet (20 mg total) by mouth daily. Lab Results  Component Value Date   LDLCALC 84 04/24/2021    Need for influenza vaccination -     Flu vaccine trivalent PF, 6mos and older(Flulaval,Afluria,Fluarix,Fluzone)    Patient has been counseled on age-appropriate routine health concerns for screening and prevention. These are reviewed and up-to-date. Referrals have been placed accordingly. Immunizations are up-to-date or declined.    Subjective:   Chief Complaint  Patient presents with   Hypertension    Connie Bryant 58 y.o. female presents to office today for follow up to HTN  She has a past medical history of Anemia, Glaucoma, Hypertension, Obesity, and Pneumonia.    Patient has been counseled on age-appropriate routine health concerns for screening and prevention. These are reviewed and up-to-date. Referrals have been placed accordingly. Immunizations are up-to-date or declined.     MAMMOGRAM: UTD:  PAP SMEAR: UTD  COLON cancer screening: UTD   HTN Blood pressure is well controlled. She is currently prescribed amlodipine  10 mg daily and clonidine  0.1 mg BID BP Readings from Last 3 Encounters:  04/19/24 132/84  10/19/23 114/76  04/20/23 (!) 143/83    She is experiencing financial stress due to wage garnishment for state taxes  from 2019, which is affecting her ability to afford her [employer]. 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 Her work is demanding, and she is the sole financial provider for her household, which includes her unemployed 18 year old son.  She is dealing with a billing issue related to her mammogram, for which she received a bill despite expecting it to be covered by her insurance. She has not contacted the billing department to resolve this issue.    Review of Systems  Constitutional:  Negative for fever, malaise/fatigue and weight loss.  HENT: Negative.  Negative for nosebleeds.   Eyes: Negative.  Negative for blurred vision, double vision and photophobia.  Respiratory: Negative.  Negative for cough and shortness of breath.   Cardiovascular: Negative.  Negative for chest pain, palpitations and leg swelling.  Gastrointestinal: Negative.  Negative for heartburn, nausea and vomiting.  Musculoskeletal: Negative.  Negative for myalgias.  Neurological: Negative.  Negative for dizziness, focal weakness, seizures and headaches.  Psychiatric/Behavioral: Negative.  Negative for suicidal ideas.     Past Medical History:  Diagnosis Date   Anemia    Glaucoma    Hypertension    Obesity    Pneumonia    2008    Past Surgical History:  Procedure Laterality Date   TUBAL LIGATION      Family History  Problem Relation Age of Onset   Diabetes Brother    Heart  disease Brother    Heart attack Brother    Diabetes Mother    Kidney disease Mother    Heart attack Mother    Diabetes Sister    Heart attack Sister    Breast cancer Cousin     Social History Reviewed with no changes to be made today.   Outpatient Medications Prior to Visit  Medication Sig Dispense Refill   betamethasone  dipropionate 0.05 % cream Apply topically 2 (two) times daily. 60 g 1    ibuprofen  (ADVIL ) 600 MG tablet Take 1 tablet (600 mg total) by mouth every 8 (eight) hours as needed for moderate pain (pain score 4-6) or cramping. Take with food. 60 tablet 1   amLODipine  (NORVASC ) 10 MG tablet Take 1 tablet (10 mg total) by mouth daily. 30 tablet 6   atorvastatin  (LIPITOR) 20 MG tablet Take 1 tablet (20 mg total) by mouth daily. 30 tablet 6   cloNIDine  (CATAPRES ) 0.1 MG tablet Take 1 tablet (0.1 mg total) by mouth 2 (two) times daily. 60 tablet 3   No facility-administered medications prior to visit.    Allergies  Allergen Reactions   Lisinopril  Cough   Oxycodone-Acetaminophen Hives   Tylox [Oxycodone-Acetaminophen] Hives       Objective:    BP 132/84 (BP Location: Left Arm, Patient Position: Sitting, Cuff Size: Normal)   Pulse 73   Resp 19   Ht 5' 10 (1.778 m)   Wt 196 lb 12.8 oz (89.3 kg)   LMP  (LMP Unknown)   SpO2 100%   BMI 28.24 kg/m  Wt Readings from Last 3 Encounters:  04/19/24 196 lb 12.8 oz (89.3 kg)  10/19/23 200 lb 12.8 oz (91.1 kg)  04/20/23 203 lb 9.6 oz (92.4 kg)    Physical Exam Vitals and nursing note reviewed.  Constitutional:      Appearance: She is well-developed.  HENT:     Head: Normocephalic and atraumatic.  Cardiovascular:     Rate and Rhythm: Normal rate and regular rhythm.     Heart sounds: Normal heart sounds. No murmur heard.    No friction rub. No gallop.  Pulmonary:     Effort: Pulmonary effort is normal. No tachypnea or respiratory distress.     Breath sounds: Normal breath sounds. No decreased breath sounds, wheezing, rhonchi or rales.  Chest:     Chest wall: No tenderness.  Musculoskeletal:        General: Normal range of motion.     Cervical back: Normal range of motion.  Skin:    General: Skin is warm and dry.  Neurological:     Mental Status: She is alert and oriented to person, place, and time.     Coordination: Coordination normal.  Psychiatric:        Behavior: Behavior normal. Behavior is  cooperative.        Thought Content: Thought content normal.        Judgment: Judgment normal.          Patient has been counseled extensively about nutrition and exercise as well as the importance of adherence with medications and regular follow-up. The patient was given clear instructions to go to ER or return to medical center if symptoms don't improve, worsen or new problems develop. The patient verbalized understanding.   Follow-up: Return in about 6 months (around 10/18/2024).   Haze LELON Servant, FNP-BC [facility] and Wellness [place], [place] [phone]   04/19/2024, 5:44 PM

## 2024-04-20 NOTE — Progress Notes (Signed)
Flu documented

## 2024-05-02 ENCOUNTER — Other Ambulatory Visit: Payer: Self-pay

## 2024-05-03 ENCOUNTER — Other Ambulatory Visit: Payer: Self-pay

## 2024-05-03 ENCOUNTER — Other Ambulatory Visit (HOSPITAL_COMMUNITY): Payer: Self-pay

## 2024-05-25 ENCOUNTER — Ambulatory Visit: Payer: Self-pay | Admitting: Nurse Practitioner

## 2024-05-25 ENCOUNTER — Other Ambulatory Visit: Payer: Self-pay

## 2024-05-25 NOTE — Telephone Encounter (Signed)
 Copied from CRM #[id number]. Topic: Clinical - Medical Advice >> May 25, 2024  1:38 PM   Lonell PEDLAR wrote:  Reason for CRM: Patient spoke with NT this morning and would like to know if something is going to be called in for her cough. Please contact pt to advise. [phone]

## 2024-05-25 NOTE — Telephone Encounter (Signed)
#  1- Attempted to reach pt at [phone] , LVM and instructed to call back at [phone]  ummary: dry cough   Reason for Triage: dry cough ,would like something to be prescribed

## 2024-05-25 NOTE — Telephone Encounter (Signed)
 Patient identified by name and date of birth.  Message sent to provider and patient aware that response will take time. I will call patient if and when medication is sent.

## 2024-05-25 NOTE — Telephone Encounter (Addendum)
 FYI Only or Action Required?: Action required by provider: medication request.  Patient was last seen in primary care on 04/19/2024 by Theotis Haze ORN, NP.  Called Nurse Triage reporting Cough.  Symptoms began several weeks ago.  Interventions attempted: OTC medications: cough syrup.  Symptoms are: gradually worsening.  Triage Disposition: See Physician Within 24 Hours  Patient/caregiver understands and will follow disposition?: No, wishes to speak with PCP  Message from [facility] sent at 05/25/2024  8:30 AM EST  Summary: dry cough   Reason for Triage: dry cough ,would like something to be prescribed      Reason for Disposition  [1] Continuous (nonstop) coughing interferes with work or school AND [2] no improvement using cough treatment per Care Advice  Answer Assessment - Initial Assessment Questions 1. ONSET: When did the cough begin?       X weeks ago  2. SEVERITY: How bad is the cough today?       Worse at night  3. SPUTUM: Describe the color of your sputum (e.g., none, dry cough; clear, white, yellow, green)      denies  4. HEMOPTYSIS: Are you coughing up any blood? If Yes, ask: How much? (e.g., flecks, streaks, tablespoons, etc.)     Denies  5. DIFFICULTY BREATHING: Are you having difficulty breathing? If Yes, ask: How bad is it? (e.g., mild, moderate, severe)      Denies  6. FEVER: Do you have a fever? If Yes, ask: What is your temperature, how was it measured, and when did it start?     Denies  7. CARDIAC HISTORY: Do you have any history of heart disease? (e.g., heart attack, congestive heart failure)         8. LUNG HISTORY: Do you have any history of lung disease?  (e.g., pulmonary embolus, asthma, emphysema)        9. PE RISK FACTORS: Do you have a history of blood clots? (or: recent major surgery, recent prolonged travel, bedridden)        10. OTHER SYMPTOMS: Do you have any other symptoms? (e.g., runny nose, wheezing, chest  pain)       Runny nose Denies chest pain or wheezing   Pt reports dry cough Pt is taking OTC cough syrup Pt offered and in clinic and virtual visit today. Pt states she is at work and cannot make it. Pt requesting medication to be sent to pharmacy for cough Pt agrees with plan of care, will call back for any worsening symptoms  Protocols used: Cough - Acute Non-Productive-A-AH

## 2024-05-25 NOTE — Telephone Encounter (Signed)
 Additional Information  Commented on: Answer Assessment    Pt has hx of HTN and Pneumonia  Pt does not have PE risk factors  Protocols used: Cough - Acute Non-Productive-A-AH

## 2024-05-26 NOTE — Telephone Encounter (Signed)
 Noted

## 2024-05-26 NOTE — Telephone Encounter (Signed)
 Copied from CRM [id number]. Topic: Clinical - Medication Question >> May 26, 2024  8:05 AM   [name] wrote:  Reason for CRM: patient called to find out if patient will send in a medication for her cough. She was made aware when and if provider will send in the script.

## 2024-05-29 ENCOUNTER — Other Ambulatory Visit: Payer: Self-pay | Admitting: Nurse Practitioner

## 2024-05-29 DIAGNOSIS — R051 Acute cough: Secondary | ICD-10-CM

## 2024-05-29 MED ORDER — BENZONATATE 200 MG PO CAPS
200.0000 mg | ORAL_CAPSULE | Freq: Two times a day (BID) | ORAL | 0 refills | Status: AC | PRN
  Filled 2024-05-29: qty 20, 10d supply, fill #0

## 2024-05-29 NOTE — Telephone Encounter (Signed)
 Tessalon  perles sent. Can also take mucinex D over the counter, warm tea with honey

## 2024-05-30 ENCOUNTER — Other Ambulatory Visit: Payer: Self-pay

## 2024-05-30 NOTE — Telephone Encounter (Signed)
 Patient identified by name and date of birth.   Patient aware of response and voiced understanding.

## 2024-05-31 ENCOUNTER — Other Ambulatory Visit: Payer: Self-pay

## 2024-06-19 ENCOUNTER — Encounter: Payer: Self-pay | Admitting: Gastroenterology

## 2024-10-31 ENCOUNTER — Ambulatory Visit: Admitting: Nurse Practitioner
